# Patient Record
Sex: Female | Born: 1950 | Race: White | Hispanic: No | Smoking: Never smoker
Health system: Southern US, Community
[De-identification: ages and names within clinical notes are randomized; demographics above are authoritative.]

## PROBLEM LIST (undated history)

## (undated) DIAGNOSIS — E079 Disorder of thyroid, unspecified: Secondary | ICD-10-CM

## (undated) DIAGNOSIS — G4700 Insomnia, unspecified: Secondary | ICD-10-CM

## (undated) DIAGNOSIS — E785 Hyperlipidemia, unspecified: Secondary | ICD-10-CM

## (undated) DIAGNOSIS — L4050 Arthropathic psoriasis, unspecified: Secondary | ICD-10-CM

## (undated) DIAGNOSIS — Z227 Latent tuberculosis: Secondary | ICD-10-CM

## (undated) DIAGNOSIS — R002 Palpitations: Secondary | ICD-10-CM

## (undated) DIAGNOSIS — Z820 Family history of epilepsy and other diseases of the nervous system: Secondary | ICD-10-CM

## (undated) DIAGNOSIS — N920 Excessive and frequent menstruation with regular cycle: Secondary | ICD-10-CM

## (undated) DIAGNOSIS — L409 Psoriasis, unspecified: Secondary | ICD-10-CM

## (undated) HISTORY — DX: Latent tuberculosis: Z22.7

## (undated) HISTORY — DX: Hyperlipidemia, unspecified: E78.5

## (undated) HISTORY — DX: Arthropathic psoriasis, unspecified: L40.50

## (undated) HISTORY — DX: Palpitations: R00.2

## (undated) HISTORY — DX: Excessive and frequent menstruation with regular cycle: N92.0

## (undated) HISTORY — DX: Disorder of thyroid, unspecified: E07.9

## (undated) HISTORY — DX: Family history of epilepsy and other diseases of the nervous system: Z82.0

## (undated) HISTORY — PX: TONSILLECTOMY: SHX5217

## (undated) HISTORY — DX: Psoriasis, unspecified: L40.9

## (undated) HISTORY — DX: Insomnia, unspecified: G47.00

## (undated) HISTORY — PX: WISDOM TOOTH EXTRACTION: SHX21

---

## 1998-05-25 ENCOUNTER — Other Ambulatory Visit: Admission: RE | Admit: 1998-05-25 | Discharge: 1998-05-25 | Payer: Self-pay | Admitting: Obstetrics & Gynecology

## 2005-08-25 ENCOUNTER — Other Ambulatory Visit: Admission: RE | Admit: 2005-08-25 | Discharge: 2005-08-25 | Payer: Self-pay | Admitting: Obstetrics & Gynecology

## 2005-09-14 ENCOUNTER — Encounter: Admission: RE | Admit: 2005-09-14 | Discharge: 2005-09-14 | Payer: Self-pay | Admitting: Obstetrics & Gynecology

## 2005-10-04 ENCOUNTER — Encounter: Admission: RE | Admit: 2005-10-04 | Discharge: 2005-10-04 | Payer: Self-pay | Admitting: Obstetrics & Gynecology

## 2005-10-10 LAB — HM COLONOSCOPY

## 2007-10-16 ENCOUNTER — Other Ambulatory Visit: Admission: RE | Admit: 2007-10-16 | Discharge: 2007-10-16 | Payer: Self-pay | Admitting: Obstetrics & Gynecology

## 2007-11-13 LAB — HM DEXA SCAN

## 2007-11-26 ENCOUNTER — Encounter: Admission: RE | Admit: 2007-11-26 | Discharge: 2007-11-26 | Payer: Self-pay | Admitting: Obstetrics & Gynecology

## 2008-10-21 ENCOUNTER — Other Ambulatory Visit: Admission: RE | Admit: 2008-10-21 | Discharge: 2008-10-21 | Payer: Self-pay | Admitting: Obstetrics & Gynecology

## 2010-03-06 IMAGING — MG MM SCREEN MAMMOGRAM BILATERAL
4 series · 4 of 4 positions shown · non-contrast
Comparison: none

DG SCREEN MAMMOGRAM BILATERAL
Bilateral CC and MLO view(s) were taken.

DIGITAL SCREENING MAMMOGRAM WITH CAD:
The breast tissue is heterogeneously dense.  No masses or malignant type calcifications are 
identified.  Compared with prior studies.

[R CC]
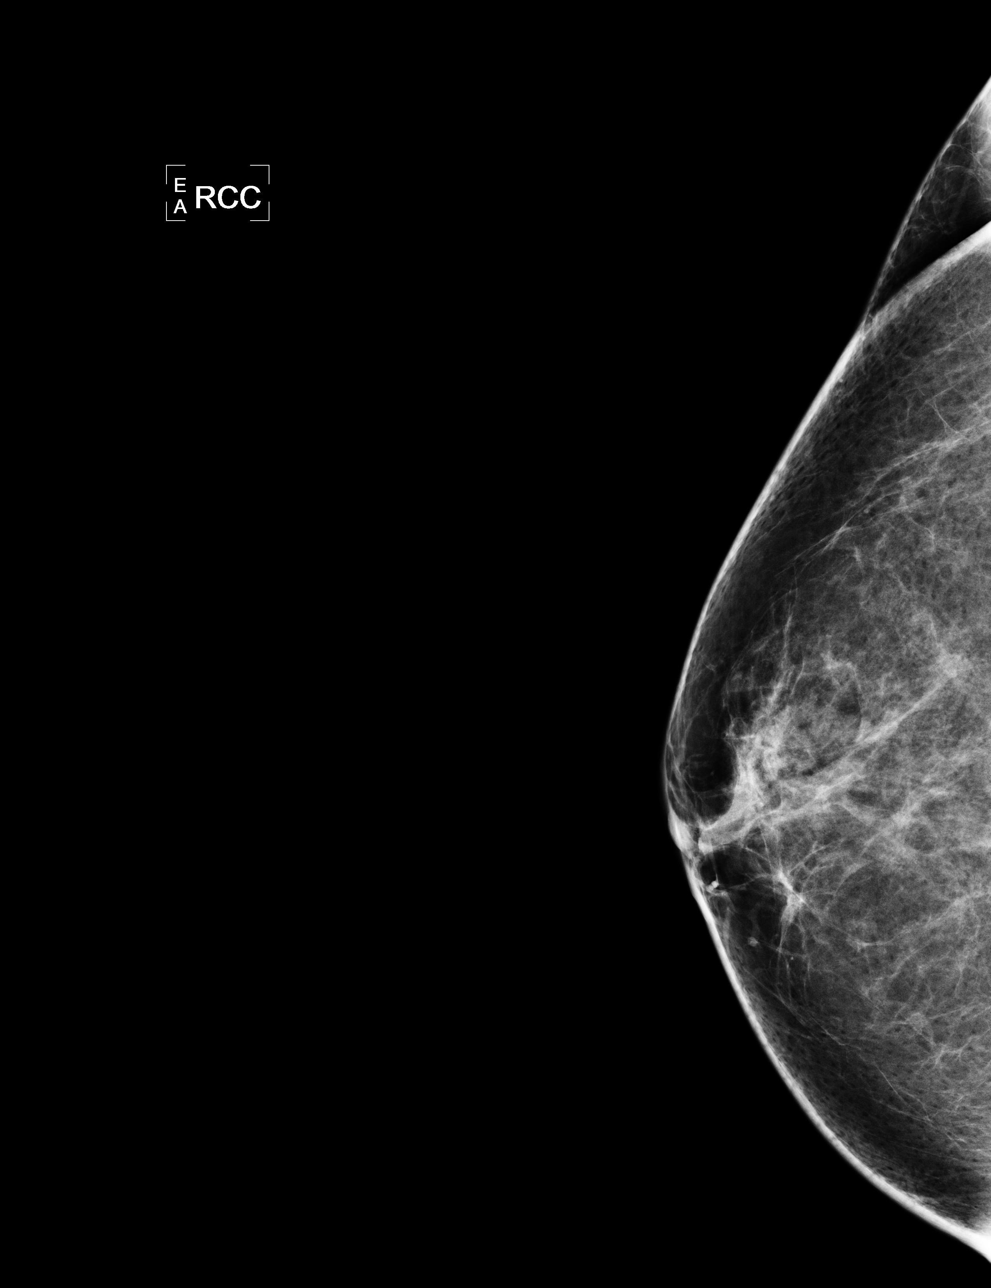

[L CC]
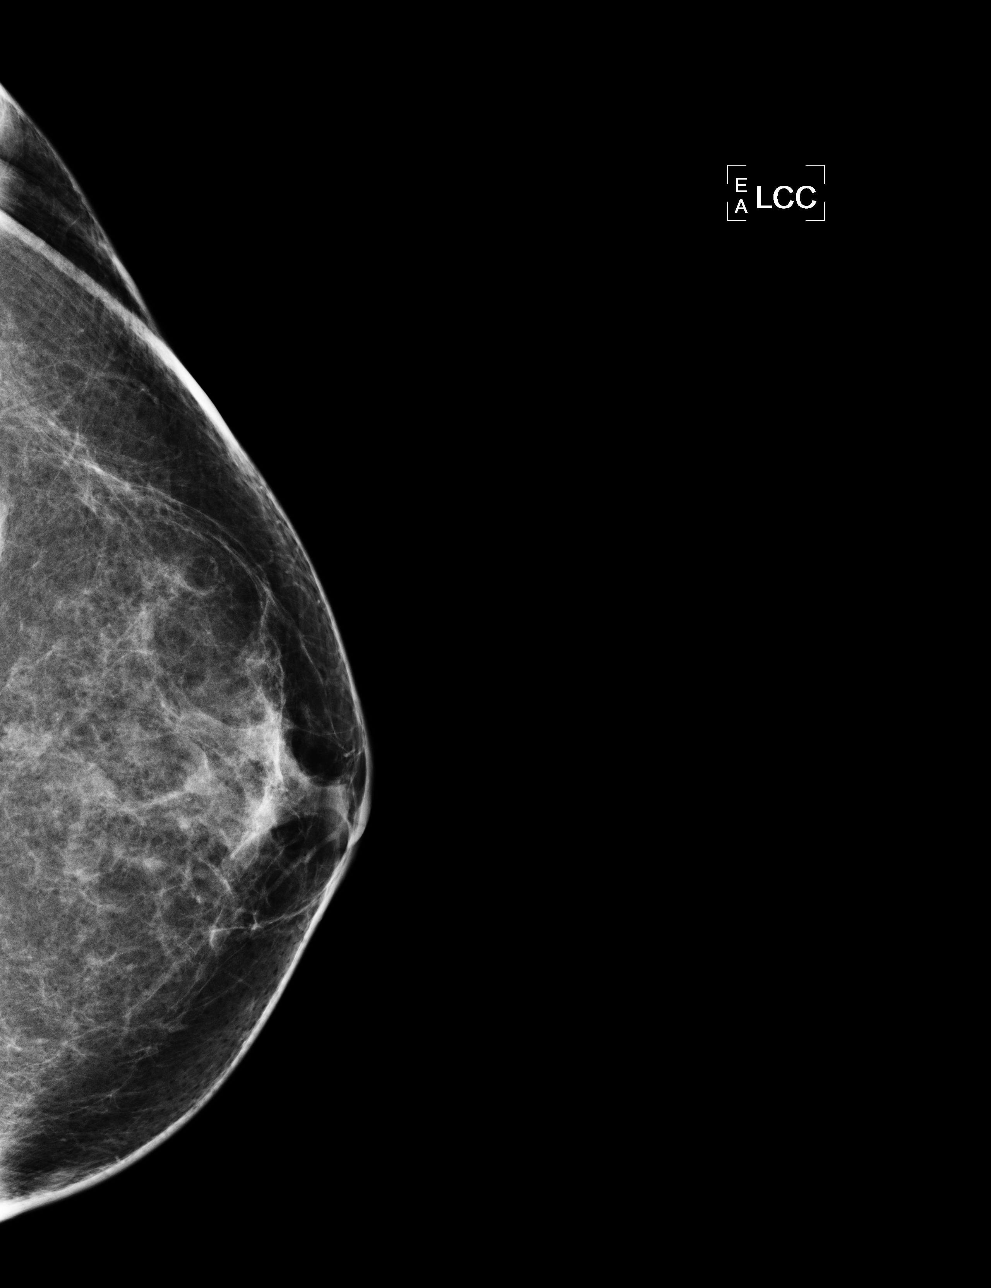

[L MLO]
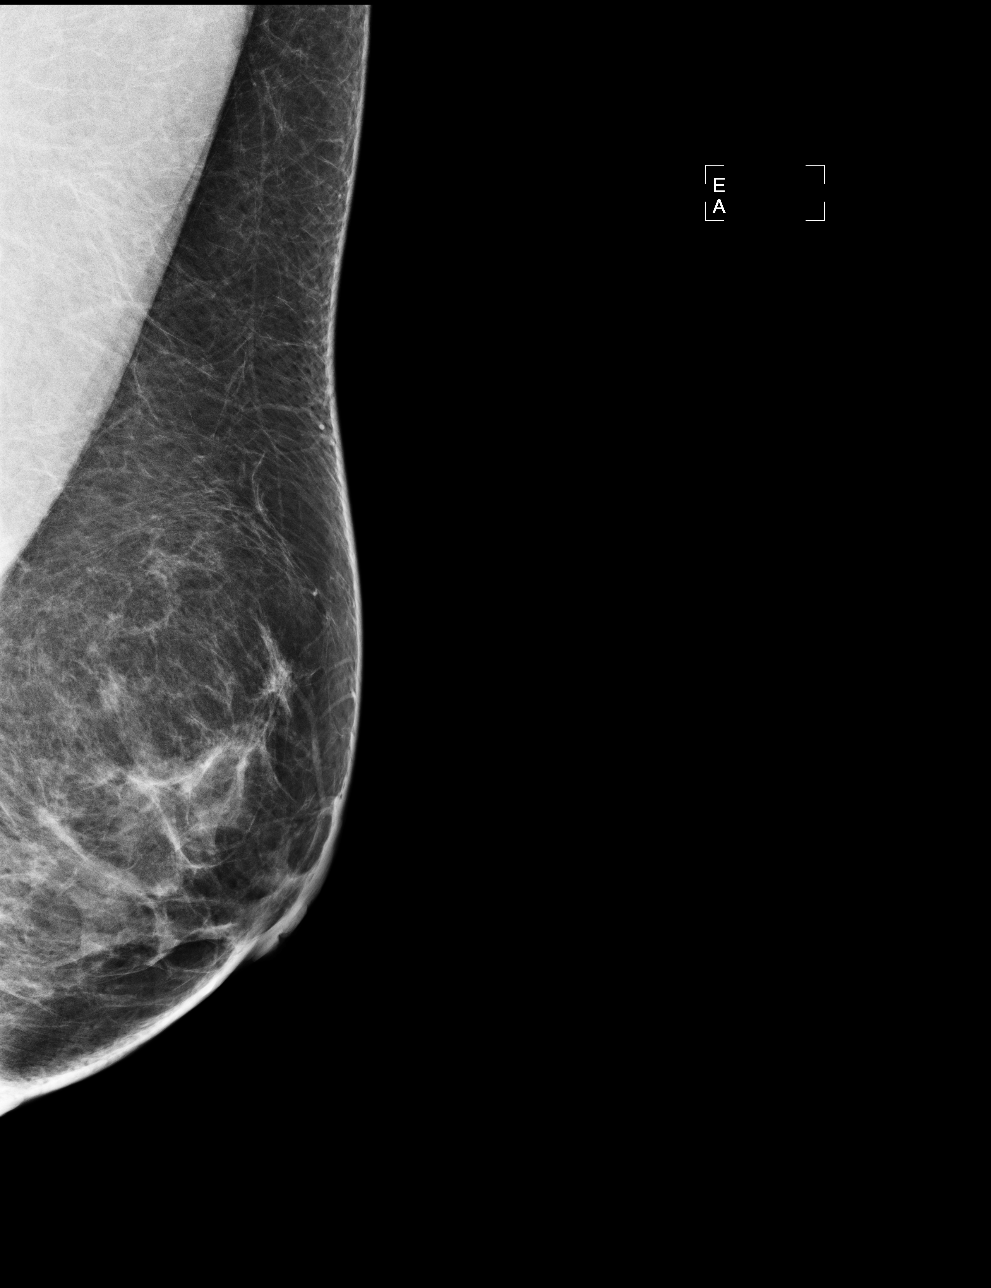

[R MLO]
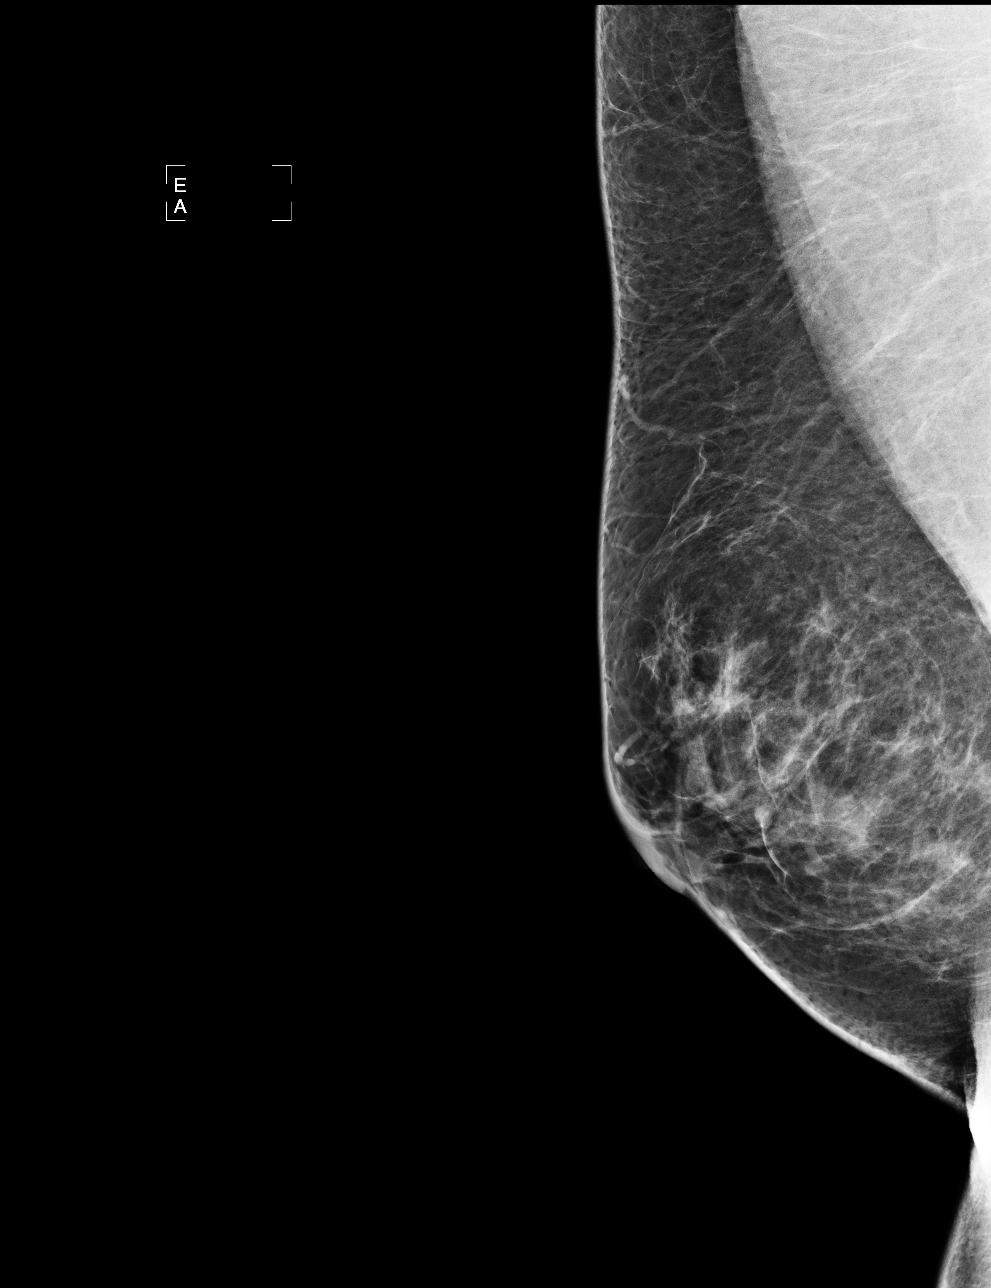

[4 of 4 positions shown; findings below may reference images not displayed]

IMPRESSION: No specific mammographic evidence of malignancy.  Next screening mammogram is recommended in one 
year.

ASSESSMENT: Negative - BI-RADS 1

Screening mammogram in 1 year.
ANALYZED BY COMPUTER AIDED DETECTION. , THIS PROCEDURE WAS A DIGITAL MAMMOGRAM.

## 2010-03-17 ENCOUNTER — Encounter: Admission: RE | Admit: 2010-03-17 | Discharge: 2010-03-17 | Payer: Self-pay | Admitting: Obstetrics & Gynecology

## 2011-02-13 ENCOUNTER — Other Ambulatory Visit: Payer: Self-pay | Admitting: Obstetrics & Gynecology

## 2011-02-13 DIAGNOSIS — Z1231 Encounter for screening mammogram for malignant neoplasm of breast: Secondary | ICD-10-CM

## 2011-03-23 ENCOUNTER — Ambulatory Visit
Admission: RE | Admit: 2011-03-23 | Discharge: 2011-03-23 | Disposition: A | Discharge status: Home / Self Care | Payer: BC Managed Care – PPO | Source: Ambulatory Visit | Attending: Obstetrics & Gynecology | Admitting: Obstetrics & Gynecology

## 2011-03-23 DIAGNOSIS — Z1231 Encounter for screening mammogram for malignant neoplasm of breast: Secondary | ICD-10-CM

## 2011-03-28 ENCOUNTER — Other Ambulatory Visit: Payer: Self-pay | Admitting: Obstetrics & Gynecology

## 2011-03-28 DIAGNOSIS — R928 Other abnormal and inconclusive findings on diagnostic imaging of breast: Secondary | ICD-10-CM

## 2011-03-30 ENCOUNTER — Ambulatory Visit
Admission: RE | Admit: 2011-03-30 | Discharge: 2011-03-30 | Disposition: A | Discharge status: Home / Self Care | Payer: BC Managed Care – PPO | Source: Ambulatory Visit | Attending: Obstetrics & Gynecology | Admitting: Obstetrics & Gynecology

## 2011-03-30 DIAGNOSIS — R928 Other abnormal and inconclusive findings on diagnostic imaging of breast: Secondary | ICD-10-CM

## 2011-10-23 ENCOUNTER — Encounter: Payer: Self-pay | Admitting: *Deleted

## 2012-02-14 ENCOUNTER — Other Ambulatory Visit: Payer: Self-pay | Admitting: Obstetrics & Gynecology

## 2012-02-14 DIAGNOSIS — Z1231 Encounter for screening mammogram for malignant neoplasm of breast: Secondary | ICD-10-CM

## 2012-03-14 DIAGNOSIS — Z227 Latent tuberculosis: Secondary | ICD-10-CM

## 2012-03-14 HISTORY — DX: Latent tuberculosis: Z22.7

## 2012-03-15 ENCOUNTER — Ambulatory Visit: Payer: BC Managed Care – PPO

## 2012-03-15 ENCOUNTER — Ambulatory Visit (INDEPENDENT_AMBULATORY_CARE_PROVIDER_SITE_OTHER): Payer: BC Managed Care – PPO | Admitting: Family Medicine

## 2012-03-15 ENCOUNTER — Telehealth: Payer: Self-pay | Admitting: Family Medicine

## 2012-03-15 VITALS — BP 126/70 | HR 72 | Temp 97.7°F | Resp 18 | Ht 64.5 in | Wt 157.2 lb

## 2012-03-15 DIAGNOSIS — R7611 Nonspecific reaction to tuberculin skin test without active tuberculosis: Secondary | ICD-10-CM

## 2012-03-15 NOTE — Telephone Encounter (Signed)
Message copied by Max Fickle on Fri Mar 15, 2012 12:21 PM ------      Message from: Abbe Amsterdam C      Created: Fri Mar 15, 2012 12:11 PM       Please call and let her know that her CXR was read as negative

## 2012-03-15 NOTE — Telephone Encounter (Signed)
Pt notified. Agrees to go to appointment at [facility]. Monday for positive TB test. JF

## 2012-03-15 NOTE — Progress Notes (Signed)
[facility] [address] [place] [address] [phone]  Date:  03/15/2012   Name:  Wendy Fletcher   DOB:  09/01/1950   MRN:  [id number]  PCP:  No primary provider on file.    Chief Complaint: PPD Reading   History of Present Illness:  Wendy Fletcher is a 61 y.o. very pleasant female patient who presents with the following:  She had a positive quantiferon gold- she is on Enbrel for psoriasis.  She has an ID appt coming up next week and would like to have her CXR done.   She is not coughing up blood, no unexpected weight loss. She had a PPD last year- she had a little induration but it was not a positive test.  No known exposures to TB, she has not traveled out of the country, and has never been in prison. 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    There is no problem list on file for this patient.   Past Medical History  Diagnosis Date  . Dyslipidemia   . Psoriasis   . Insomnia     long Hx of who presents for evaluation of palpitations and chest discomfort  . Palpitations   . FHx: Alzheimer's disease     Positive    Past Surgical History  Procedure Date  . Tonsillectomy     History  Substance Use Topics  . Smoking status: Never Smoker   . Smokeless tobacco: Not on file  . Alcohol Use: Yes     Wine    No family history on file.  No Known Allergies  Medication list has been reviewed and updated.  Current Outpatient Prescriptions on File Prior to Visit  Medication Sig Dispense Refill  . atorvastatin (LIPITOR) 10 MG tablet Take 10 mg by mouth daily.      Marland Kitchen levothyroxine (SYNTHROID, LEVOTHROID) 50 MCG tablet Take 50 mcg by mouth daily.      Marland Kitchen zolpidem (AMBIEN) 5 MG tablet Take 10 mg by mouth at bedtime as needed. Takes 1/2 tablet qhs      . Bromelains (BROMELAIN PO) Take 300 mg by mouth 2 (two) times daily.      Marland Kitchen etanercept (ENBREL) 50 MG/ML injection Inject 50 mg into the skin once a week. Twice      . NON FORMULARY Tumeric 600mg  Twice a Day        . sertraline (ZOLOFT) 50 MG tablet Take 50 mg by mouth daily.        Review of Systems:  As per HPI- otherwise negative.   Physical Examination: Filed Vitals:   03/15/12 1032  BP: 126/70  Pulse: 72  Temp: 97.7 F (36.5 C)  Resp: 18   Filed Vitals:   03/15/12 1032  Height: 5' 4.5" (1.638 m)  Weight: 157 lb 3.2 oz (71.305 kg)   Body mass index is 26.57 kg/(m^2). Ideal Body Weight: Weight in (lb) to have BMI = 25: 147.6   GEN: WDWN, NAD, Non-toxic, A & O x 3 HEENT: Atraumatic, Normocephalic. Neck supple. No masses, No LAD. Ears and Nose: No external deformity. CV: RRR, No M/G/R. No JVD. No thrill. No extra heart sounds. PULM: CTA B, no wheezes, crackles, rhonchi. No retractions. No resp. distress. No accessory muscle use. ABD: S, NT, ND, +BS. No rebound. No HSM. EXTR: No c/c/e NEURO Normal gait.  PSYCH: Normally interactive. Conversant. Not depressed or anxious appearing.  Calm demeanor.  [facility] reading (PRIMARY) by  Dr.  Lurene Robley.  Negative chest, no sign of acute pulmonary TB  CHEST - 2 VIEW  Comparison: 08/11/2010  Findings: Heart and mediastinal contours are within normal limits. No focal opacities or effusions. No acute bony abnormality.  IMPRESSION: No active cardiopulmonary disease.   Derm is Campbell Stall, ID is at [facility] ID, Dr. Drue Second  Assessment and Plan: 1. Positive TB test  DG Chest 2 View   Await radiology over- read report.  Will fax result to her doctors as above and let her know as well.   Jonee Lamore, MD  Called pt and let her know CXR read as negative, will fax to her doctors.

## 2012-03-18 ENCOUNTER — Ambulatory Visit (INDEPENDENT_AMBULATORY_CARE_PROVIDER_SITE_OTHER): Payer: BC Managed Care – PPO | Admitting: Internal Medicine

## 2012-03-18 ENCOUNTER — Encounter: Payer: Self-pay | Admitting: Internal Medicine

## 2012-03-18 VITALS — BP 131/81 | HR 73 | Temp 97.5°F | Wt 159.0 lb

## 2012-03-18 DIAGNOSIS — L409 Psoriasis, unspecified: Secondary | ICD-10-CM

## 2012-03-18 DIAGNOSIS — Z227 Latent tuberculosis: Secondary | ICD-10-CM

## 2012-03-18 DIAGNOSIS — L408 Other psoriasis: Secondary | ICD-10-CM

## 2012-03-18 DIAGNOSIS — A150 Tuberculosis of lung: Secondary | ICD-10-CM

## 2012-03-18 LAB — COMPREHENSIVE METABOLIC PANEL
ALT: 18 U/L (ref 0–35)
AST: 21 U/L (ref 0–37)
Albumin: 4.6 g/dL (ref 3.5–5.2)
Alkaline Phosphatase: 46 U/L (ref 39–117)
BUN: 17 mg/dL (ref 6–23)
CO2: 27 mEq/L (ref 19–32)
Calcium: 9.3 mg/dL (ref 8.4–10.5)
Chloride: 103 mEq/L (ref 96–112)
Creat: 0.85 mg/dL (ref 0.50–1.10)
Glucose, Bld: 85 mg/dL (ref 70–99)
Potassium: 4.2 mEq/L (ref 3.5–5.3)
Sodium: 140 mEq/L (ref 135–145)
Total Bilirubin: 0.4 mg/dL (ref 0.3–1.2)
Total Protein: 6.8 g/dL (ref 6.0–8.3)

## 2012-03-18 MED ORDER — ISONIAZID 300 MG PO TABS: 900.0000 mg | ORAL_TABLET | Freq: Every day | ORAL | Status: AC

## 2012-03-18 MED ORDER — RIFAPENTINE 150 MG PO TABS: 900.0000 mg | ORAL_TABLET | ORAL | Status: DC

## 2012-03-18 NOTE — Progress Notes (Signed)
INFECTIOUS DISEASES CLINIC  RFV: newly positive quantiferon in patient on enbrel Subjective:    Patient ID: Wendy Fletcher, female    DOB: November 01, 1950, 61 y.o.   MRN: [id number]  HPI Wendy Fletcher is 61yo F with hypothyroidism, HLD, and psoriasis who has been on etanercept for the past 3 years. She was tested this year with IGRA which was positive, where as last year her skin test had no induration but slow to resolve erythema/- IGRA. She was referred to our clinic to seek evaluation and treatment for latent TB. She has had a baseline CXR which was unremarkable. Wendy Fletcher has denied having any new symptoms of productive cough, weight loss or persistent nightsweats. She does feel that she has an occ cough that she associates with seasonal allergies and has nightsweats intermittently due to menopause.   No recent international travel, she did travel to [place] and [place] for church mission trips roughly 2 years ago. She states that her trip in [place] was mostly demolition/not much interaction with [place]  She works full time doing Airline pilot in [employer], [employer]. No smoking. Drinks wine.  She describes her psoriasis involving extensor surfaces of arms/elbows and involves torso and legs, thought to look like a "bad rash".Since having + quantiferon, she has been taking off of enbrel for the last 10 days.  Current Outpatient Prescriptions on File Prior to Visit  Medication Sig Dispense Refill  . atorvastatin (LIPITOR) 10 MG tablet Take 10 mg by mouth daily.      . Bromelains (BROMELAIN PO) Take 300 mg by mouth 2 (two) times daily.      Marland Kitchen etanercept (ENBREL) 50 MG/ML injection Inject 50 mg into the skin once a week. Twice      . levothyroxine (SYNTHROID, LEVOTHROID) 50 MCG tablet Take 50 mcg by mouth daily.      . NON FORMULARY Tumeric 600mg  Twice a Day      . Ospemifene (OSPHENA) 60 MG TABS Take 60 mg by mouth daily.      . sertraline (ZOLOFT) 100 MG tablet Take 100 mg by mouth. Takes 200 mg daily      .  sertraline (ZOLOFT) 50 MG tablet Take 50 mg by mouth daily.      Marland Kitchen zolpidem (AMBIEN) 5 MG tablet Take 10 mg by mouth at bedtime as needed. Takes 1/2 tablet qhs       Active Ambulatory Problems    Diagnosis Date Noted  . No Active Ambulatory Problems   Resolved Ambulatory Problems    Diagnosis Date Noted  . No Resolved Ambulatory Problems   Past Medical History  Diagnosis Date  . Dyslipidemia   . Psoriasis   . Insomnia   . Palpitations   . FHx: Alzheimer's disease    History  Substance Use Topics  . Smoking status: Never Smoker   . Smokeless tobacco: Not on file  . Alcohol Use: Yes     Wine   Family hx = alzheimer's disease = father/ skin cancer = mother  Review of Systems   Constitutional: Negative for fever, chills, diaphoresis, activity change, appetite change, fatigue and unexpected weight change.  HENT: Negative for congestion, sore throat, rhinorrhea, sneezing, trouble swallowing and sinus pressure.  Eyes: Negative for photophobia and visual disturbance.  Respiratory: Negative for cough, chest tightness, shortness of breath, wheezing and stridor.  Cardiovascular: Negative for chest pain, palpitations and leg swelling.  Gastrointestinal: Negative for nausea, vomiting, abdominal pain, diarrhea, constipation, blood in stool, abdominal distention and  anal bleeding.  Genitourinary: Negative for dysuria, hematuria, flank pain and difficulty urinating.  Musculoskeletal: Negative for myalgias, back pain, joint swelling, arthralgias and gait problem.  Skin: per hpi Neurological: Negative for dizziness, tremors, weakness and light-headedness.  Hematological: Negative for adenopathy. Does not bruise/bleed easily.  Psychiatric/Behavioral: Negative for behavioral problems, confusion, sleep disturbance, dysphoric mood, decreased concentration and agitation.        Objective:   Physical Exam  BP 131/81  Pulse 73  Temp 97.5 F (36.4 C) (Oral)  Wt 159 lb (72.122  kg)   General Appearance:    Alert, cooperative, no distress, appears stated age  Head:    Normocephalic, without obvious abnormality, atraumatic  Eyes:    PERRL, conjunctiva/corneas clear, EOM's intact,  Ears:    Normal TM's and external ear canals, both ears  Nose:   Nares normal, septum midline, mucosa normal, no drainage    or sinus tenderness  Throat:   Lips, mucosa, and tongue normal; teeth and gums normal  Neck:   Supple, symmetrical, trachea midline, no adenopathy;         Lungs:     Clear to auscultation bilaterally, respirations unlabored      Heart:    Regular rate and rhythm, S1 and S2 normal, no murmur, rub   or gallop              Extremities:   Extremities normal, atraumatic, no cyanosis or edema  Pulses:   2+ and symmetric all extremities  Skin:   Dry erythamatous areas noted on elbows bilaterally, scattered < cm erythamatous, scaly lesions to lower extremities  Lymph nodes:   Cervical, supraclavicular, and axillary nodes normal     Historic images: cxr negative for signs for TB     Assessment & Plan:  Latent TB = It appears that the patient has recently been exposed to TB within the last year since her IGRA negative in 2012, and now positive. Unclear where she could have had TB exposure since no recent international travel. We reviewed that she has 10% lifetime risk of getting TB, with 5% in the first year. Drugs such as ertanecept places one at risk for active TB thus highly recommended to get treatment.  We have discussed the various options for latent TB ( 9 mo INH, 4 mo RIF, or 12-weekly doses of INH plus rifapentine- under modified DOT). The patient would like to try the quickest option so that she can resume back on enbrel for her psoriasis.  She is willing to try 12- week modified DOT regimen of INH plus rifapentine. She will dutifully call on a weekly basis after taking her medication. She understands that she will need to either call or come to clinic to be  observed taking the medication.  - INH 900mg  weekly ( x 12 wks) - rifapentine 900mg  weekly ( x 12 wks) - vitamin B 6, 50mg , take 1 tablet daily  - asked to abstain from alcohol the day of the week she chooses to take medications - reviewed SE of medications - provided CDC handout on th e 12-dose regimen for LTBI   we will get baseline CMP and recheck in 4 wks.  RTC in 4 wks.

## 2012-03-19 ENCOUNTER — Telehealth: Payer: Self-pay | Admitting: Licensed Clinical Social Worker

## 2012-03-19 NOTE — Telephone Encounter (Signed)
Pharmacist called to verify quantity of Rifapentine which was 6 tablets weekly to equal 900 mg. Pharmacist states usual dosage is 8 tablets weekly and it is pre packaged that way the patient will get 96 instead of 72 but she will instruct the patient only to take 6 weekly instead of 8.

## 2012-03-25 ENCOUNTER — Ambulatory Visit
Admission: RE | Admit: 2012-03-25 | Discharge: 2012-03-25 | Disposition: A | Discharge status: Home / Self Care | Payer: BC Managed Care – PPO | Source: Ambulatory Visit | Attending: Obstetrics & Gynecology | Admitting: Obstetrics & Gynecology

## 2012-03-25 DIAGNOSIS — Z1231 Encounter for screening mammogram for malignant neoplasm of breast: Secondary | ICD-10-CM

## 2012-03-26 ENCOUNTER — Ambulatory Visit: Payer: BC Managed Care – PPO | Admitting: Internal Medicine

## 2012-04-03 ENCOUNTER — Ambulatory Visit (INDEPENDENT_AMBULATORY_CARE_PROVIDER_SITE_OTHER): Payer: BC Managed Care – PPO | Admitting: Internal Medicine

## 2012-04-03 ENCOUNTER — Encounter: Payer: Self-pay | Admitting: Internal Medicine

## 2012-04-03 VITALS — BP 110/70 | HR 118 | Temp 99.9°F | Wt 160.0 lb

## 2012-04-03 DIAGNOSIS — R110 Nausea: Secondary | ICD-10-CM

## 2012-04-03 LAB — CBC WITH DIFFERENTIAL/PLATELET
Basophils Absolute: 0 10*3/uL (ref 0.0–0.1)
Basophils Relative: 0 % (ref 0–1)
Eosinophils Absolute: 0 10*3/uL (ref 0.0–0.7)
Eosinophils Relative: 0 % (ref 0–5)
HCT: 36 % (ref 36.0–46.0)
Hemoglobin: 12.4 g/dL (ref 12.0–15.0)
Lymphocytes Relative: 2 % — ABNORMAL LOW (ref 12–46)
Lymphs Abs: 0.1 10*3/uL — ABNORMAL LOW (ref 0.7–4.0)
MCH: 30.2 pg (ref 26.0–34.0)
MCHC: 34.4 g/dL (ref 30.0–36.0)
MCV: 87.8 fL (ref 78.0–100.0)
Monocytes Absolute: 0.1 10*3/uL (ref 0.1–1.0)
Monocytes Relative: 2 % — ABNORMAL LOW (ref 3–12)
Neutro Abs: 6.6 10*3/uL (ref 1.7–7.7)
Neutrophils Relative %: 96 % — ABNORMAL HIGH (ref 43–77)
Platelets: 155 10*3/uL (ref 150–400)
RBC: 4.1 MIL/uL (ref 3.87–5.11)
RDW: 13.1 % (ref 11.5–15.5)
WBC: 6.9 10*3/uL (ref 4.0–10.5)

## 2012-04-03 MED ORDER — PROMETHAZINE HCL 12.5 MG PO TABS: 12.5000 mg | ORAL_TABLET | Freq: Four times a day (QID) | ORAL | Status: DC | PRN

## 2012-04-03 NOTE — Progress Notes (Signed)
INFECTIOUS DISEASES CLINIC   RFV: sudden onset chills, myalgias fever while on latent TB treatment Subjective:    Patient ID: Wendy Fletcher, female    DOB: 06-Apr-1951, 61 y.o.   MRN: [id number]  HPI Wendy Fletcher is 61yo F with hypothyroidism, HLD, and psoriasis who has been on etanercept for the past 3 years. She was tested this year with IGRA which was positive, where as last year her skin test had no induration but slow to resolve erythema/- IGRA. She was referred to our clinic to seek evaluation and treatment for latent TB. She has had a baseline CXR which was unremarkable. She has been off of enbrel for the last 4 wks, and started to take weekly doses of rifapentine and INH in addition to vitamin B6. She states that she notices having a day's worth of nausea after taking her weekly dose. Last night she took her scheduled dose at 6:30pm on Tuesday night. This is her 3rd week of treatment. She woke up at 4am with shaking chills, aching and headache. Eased off 5 hrs later, then temp of 101. With headache behind eyes. No vomiting but nausea. No neck pain. Just muscle aches from rigors. Feels fatigued this morning due to poor sleep from this event. She states that she also has mild abdominal discomfort, mostly on LUQ/LLQ. She took a dose of tylenol this morning as instructed over the phone which has helped her fever. No sick contacts. One friend with a cold.  Current Outpatient Prescriptions on File Prior to Visit  Medication Sig Dispense Refill  . atorvastatin (LIPITOR) 10 MG tablet Take 10 mg by mouth daily.      . Bromelains (BROMELAIN PO) Take 300 mg by mouth 2 (two) times daily.      Marland Kitchen etanercept (ENBREL) 50 MG/ML injection Inject 50 mg into the skin once a week. Twice      . levothyroxine (SYNTHROID, LEVOTHROID) 50 MCG tablet Take 50 mcg by mouth daily.      . NON FORMULARY Tumeric 600mg  Twice a Day      . Ospemifene (OSPHENA) 60 MG TABS Take 60 mg by mouth daily.      . Rifapentine 150 MG TABS  Take 6 tablets (900 mg total) by mouth once a week.  72 tablet  0  . sertraline (ZOLOFT) 100 MG tablet Take 100 mg by mouth. Takes 200 mg daily      . sertraline (ZOLOFT) 50 MG tablet Take 50 mg by mouth daily.      Marland Kitchen zolpidem (AMBIEN) 5 MG tablet Take 10 mg by mouth at bedtime as needed. Takes 1/2 tablet qhs      . promethazine (PHENERGAN) 12.5 MG tablet Take 1 tablet (12.5 mg total) by mouth every 6 (six) hours as needed for nausea.  30 tablet  0   Active Ambulatory Problems    Diagnosis Date Noted  . 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 Psoriasis 03/18/2012   Resolved Ambulatory Problems    Diagnosis Date Noted  . No Resolved Ambulatory Problems   Past Medical History  Diagnosis Date  . Dyslipidemia   . Insomnia   . Palpitations   . FHx: Alzheimer's disease     Review of Systems Constitutional: Negative for fever, chills, diaphoresis, activity change, appetite change, fatigue and unexpected weight change.  HENT: Negative for congestion, sore throat, rhinorrhea, sneezing, trouble swallowing and sinus pressure.  Eyes: Negative for photophobia and visual disturbance.  Respiratory: Negative for cough, chest tightness, shortness of  breath, wheezing and stridor.  Cardiovascular: Negative for chest pain, palpitations and leg swelling.  Gastrointestinal: Negative for nausea, vomiting, abdominal pain, diarrhea, constipation, blood in stool, abdominal distention and anal bleeding.  Genitourinary: Negative for dysuria, hematuria, flank pain and difficulty urinating.  Musculoskeletal: Negative for myalgias, back pain, joint swelling, arthralgias and gait problem.  Skin: Negative for color change, pallor, rash and wound.  Neurological: Negative for dizziness, tremors, weakness and light-headedness.  Hematological: Negative for adenopathy. Does not bruise/bleed easily.  Psychiatric/Behavioral: Negative for behavioral problems, confusion, sleep disturbance, dysphoric mood, decreased concentration and  agitation.     Objective:   Physical Exam  BP 110/70  Pulse 118  Temp 99.9 F (37.7 C) (Oral)  Wt 160 lb (72.576 kg)   General Appearance:    Alert, cooperative, no distress, appears stated age  Head:    Normocephalic, without obvious abnormality, atraumatic  Eyes:    PERRL, conjunctiva/corneas clear, EOM's intact,  Ears:    Normal TM's and external ear canals, both ears  Nose:   Nares normal, septum midline, mucosa normal, no drainage    or sinus tenderness  Throat:   Lips, mucosa, and tongue normal; teeth and gums normal  Neck:   Supple, symmetrical, trachea midline, no adenopathy;      Back:     Symmetric, no curvature, ROM normal, no CVA tenderness  Lungs:     Clear to auscultation bilaterally, respirations unlabored  Chest Wall:    No tenderness or deformity   Heart:    Regular rate and rhythm, S1 and S2 normal, no murmur, rub   or gallop     Abdomen:     Soft, non-tender, bowel sounds active all four quadrants,    no masses, no organomegaly        Extremities:   Extremities normal, atraumatic, no cyanosis or edema  Pulses:   2+ and symmetric all extremities  Skin:   Skin color, +rash consistent with psoriasis  Lymph nodes:   Cervical, supraclavicular, and axillary nodes normal           Assessment & Plan:   61 yo F in previous good health presents with acute onset of ILI, fever, rigors, myalgia, mild headache. Unclear etiology.concern for Drug SE due to latent TB regimen vs. Viral illness.  This could possibly be drug SE, but other ddx of viral illness would also be part of differential  - will ask her to continue with supportive care. Tylenol for fevers, drink fluids to keep hydrated. Anti emetic prn  Will check cbc with diff, and cmp.  Will call pt tomorrow with results and check in on Friday to see if need to come back to clinic  Nausea = give rx of phenergan 12.5mg  Q6hr prn  Latent TB = will decide in the next few days if we need to change to a different  regimen, such as rifampin 300mg  BID vs. Staying on the present regimen of 12 weekly doses of rifapentin, and INH.  25 minutes spent with patient to assess current illness, counseling and coordinating testing.

## 2012-04-04 LAB — COMPREHENSIVE METABOLIC PANEL
ALT: 23 U/L (ref 0–35)
AST: 23 U/L (ref 0–37)
Albumin: 4.7 g/dL (ref 3.5–5.2)
Alkaline Phosphatase: 47 U/L (ref 39–117)
BUN: 21 mg/dL (ref 6–23)
CO2: 23 mEq/L (ref 19–32)
Calcium: 9.4 mg/dL (ref 8.4–10.5)
Chloride: 102 mEq/L (ref 96–112)
Creat: 0.97 mg/dL (ref 0.50–1.10)
Glucose, Bld: 109 mg/dL — ABNORMAL HIGH (ref 70–99)
Potassium: 4.2 mEq/L (ref 3.5–5.3)
Sodium: 137 mEq/L (ref 135–145)
Total Bilirubin: 1 mg/dL (ref 0.3–1.2)
Total Protein: 6.8 g/dL (ref 6.0–8.3)

## 2012-04-16 ENCOUNTER — Other Ambulatory Visit: Payer: Self-pay | Admitting: Internal Medicine

## 2012-04-16 DIAGNOSIS — Z227 Latent tuberculosis: Secondary | ICD-10-CM

## 2012-04-16 MED ORDER — RIFAMPIN 300 MG PO CAPS: 300.0000 mg | ORAL_CAPSULE | Freq: Two times a day (BID) | ORAL | Status: AC

## 2012-04-18 ENCOUNTER — Encounter: Payer: Self-pay | Admitting: Internal Medicine

## 2012-04-18 ENCOUNTER — Ambulatory Visit (INDEPENDENT_AMBULATORY_CARE_PROVIDER_SITE_OTHER): Payer: BC Managed Care – PPO | Admitting: Internal Medicine

## 2012-04-18 VITALS — BP 118/78 | HR 72 | Temp 97.9°F | Wt 154.4 lb

## 2012-04-18 DIAGNOSIS — Z227 Latent tuberculosis: Secondary | ICD-10-CM

## 2012-04-18 DIAGNOSIS — A150 Tuberculosis of lung: Secondary | ICD-10-CM

## 2012-04-18 LAB — CBC WITH DIFFERENTIAL/PLATELET
Basophils Absolute: 0.2 10*3/uL — ABNORMAL HIGH (ref 0.0–0.1)
Basophils Relative: 2 % — ABNORMAL HIGH (ref 0–1)
Eosinophils Absolute: 0.1 10*3/uL (ref 0.0–0.7)
Eosinophils Relative: 2 % (ref 0–5)
HCT: 35.1 % — ABNORMAL LOW (ref 36.0–46.0)
Hemoglobin: 12.2 g/dL (ref 12.0–15.0)
Lymphocytes Relative: 31 % (ref 12–46)
Lymphs Abs: 2 10*3/uL (ref 0.7–4.0)
MCH: 30.7 pg (ref 26.0–34.0)
MCHC: 34.8 g/dL (ref 30.0–36.0)
MCV: 88.4 fL (ref 78.0–100.0)
Monocytes Absolute: 0.5 10*3/uL (ref 0.1–1.0)
Monocytes Relative: 8 % (ref 3–12)
Neutro Abs: 3.8 10*3/uL (ref 1.7–7.7)
Neutrophils Relative %: 57 % (ref 43–77)
Platelets: 342 10*3/uL (ref 150–400)
RBC: 3.97 MIL/uL (ref 3.87–5.11)
RDW: 13.4 % (ref 11.5–15.5)
WBC: 6.6 10*3/uL (ref 4.0–10.5)

## 2012-04-18 LAB — COMPLETE METABOLIC PANEL WITH GFR
ALT: 106 U/L — ABNORMAL HIGH (ref 0–35)
AST: 51 U/L — ABNORMAL HIGH (ref 0–37)
Albumin: 4.4 g/dL (ref 3.5–5.2)
Alkaline Phosphatase: 65 U/L (ref 39–117)
BUN: 17 mg/dL (ref 6–23)
CO2: 27 mEq/L (ref 19–32)
Calcium: 9.1 mg/dL (ref 8.4–10.5)
Chloride: 105 mEq/L (ref 96–112)
Creat: 0.85 mg/dL (ref 0.50–1.10)
GFR, Est African American: 87 mL/min
GFR, Est Non African American: 76 mL/min
Glucose, Bld: 87 mg/dL (ref 70–99)
Potassium: 4.6 mEq/L (ref 3.5–5.3)
Sodium: 139 mEq/L (ref 135–145)
Total Bilirubin: 0.9 mg/dL (ref 0.3–1.2)
Total Protein: 7.1 g/dL (ref 6.0–8.3)

## 2012-04-18 NOTE — Progress Notes (Signed)
INFECTIOUS DISEASE CLINIC  RFV: latent TB treatment Subjective:    Patient ID: Wendy Fletcher, female    DOB: 1950/11/08, 61 y.o.   MRN: [id number]  [facility] yo F with psoriasis previously on enbrel. Had recent + quantiferon and came to ID clinic roughly 4-5 wks ago to seek treatment for latent TB. She tried to do 12 week regimen but had significant SE on the 3rd and 4th week of dosing. ILI with fevers and malaise that would resolve after 5 days. Initially thought to be viral illness, but then symptoms occurred again after rechallenge this past week. Thus, i asked her to discontinue INH and rifapentin and switched to rifampin Tuesday night . Still fatigued from last bout of symptoms. Appetite decreased but increased thrust.   Dermatologist seen last week regarding her psoriasis. Getting worse on calves, elbows. At this stage it is feasibly worse than our last visit.  Review of Systems Review of Systems  Constitutional:+ fatigue. Negative for fever, chills, diaphoresis, activity change, appetite change, fatigue and unexpected weight change.  HENT: Negative for congestion, sore throat, rhinorrhea, sneezing, trouble swallowing and sinus pressure.  Eyes: Negative for photophobia and visual disturbance.  Respiratory: Negative for cough, chest tightness, shortness of breath, wheezing and stridor.  Cardiovascular: Negative for chest pain, palpitations and leg swelling.  Gastrointestinal: Negative for nausea, vomiting, abdominal pain, diarrhea, constipation, blood in stool, abdominal distention and anal bleeding.  Genitourinary: Negative for dysuria, hematuria, flank pain and difficulty urinating.  Musculoskeletal: Negative for myalgias, back pain, joint swelling, arthralgias and gait problem.  Skin: worsening psoriasis Neurological: Negative for dizziness, tremors, weakness and light-headedness.  Hematological: Negative for adenopathy. Does not bruise/bleed easily.  Psychiatric/Behavioral: Negative for  behavioral problems, confusion, sleep disturbance, dysphoric mood, decreased concentration and agitation.       Objective:   Physical Exam  BP 118/78  Pulse 72  Temp 97.9 F (36.6 C) (Oral)  Wt 154 lb 6 oz (70.024 kg) Physical Exam  Constitutional:  oriented to person, place, and time. appears well-developed and well-nourished. No distress.  HENT:  Mouth/Throat: Oropharynx is clear and moist. No oropharyngeal exudate.  Cardiovascular: Normal rate, regular rhythm and normal heart sounds. Exam reveals no gallop and no friction rub. No murmur heard.  Pulmonary/Chest: Effort normal and breath sounds normal. No respiratory distress.  no wheezes.  Abdominal: Soft. Bowel sounds are normal. He exhibits no distension. There is no tenderness.  Lymphadenopathy:  no cervical adenopathy.  Neurological: He is alert and oriented to person, place, and time.  Skin: worsening lesions to back of her calves bilaterally. Some plaques on elbows and scattered erythamatous lesions to arms.         Assessment & Plan:  Latent tb: will finish up her course with rifampin 300mg  BID to take for the next 3 months to finish 4 month course of therapy.  - labs: will check cbc, cmp, magnesium and phosphorus  Psoriasis = can resume therapy for psoriasis in 1 month if need be. Ideally would like her to finish her therapy for latent tb, but if her psoriasis worsens than she can start back on enbrel in 4-6 wks - dermatologist -> Wendy Fletcher.

## 2012-05-02 ENCOUNTER — Other Ambulatory Visit: Payer: Self-pay | Admitting: Internal Medicine

## 2012-05-02 DIAGNOSIS — Z227 Latent tuberculosis: Secondary | ICD-10-CM

## 2012-05-03 ENCOUNTER — Other Ambulatory Visit: Payer: BC Managed Care – PPO

## 2012-05-03 DIAGNOSIS — Z227 Latent tuberculosis: Secondary | ICD-10-CM

## 2012-05-03 LAB — CBC WITH DIFFERENTIAL/PLATELET
Basophils Absolute: 0.1 10*3/uL (ref 0.0–0.1)
Basophils Relative: 1 % (ref 0–1)
Eosinophils Absolute: 0.2 10*3/uL (ref 0.0–0.7)
Eosinophils Relative: 3 % (ref 0–5)
HCT: 36.9 % (ref 36.0–46.0)
Hemoglobin: 12.2 g/dL (ref 12.0–15.0)
Lymphocytes Relative: 31 % (ref 12–46)
Lymphs Abs: 1.5 10*3/uL (ref 0.7–4.0)
MCH: 30.3 pg (ref 26.0–34.0)
MCHC: 33.1 g/dL (ref 30.0–36.0)
MCV: 91.8 fL (ref 78.0–100.0)
Monocytes Absolute: 0.3 10*3/uL (ref 0.1–1.0)
Monocytes Relative: 5 % (ref 3–12)
Neutro Abs: 3 10*3/uL (ref 1.7–7.7)
Neutrophils Relative %: 60 % (ref 43–77)
Platelets: 188 10*3/uL (ref 150–400)
RBC: 4.02 MIL/uL (ref 3.87–5.11)
RDW: 13.5 % (ref 11.5–15.5)
WBC: 4.9 10*3/uL (ref 4.0–10.5)

## 2012-05-03 LAB — MAGNESIUM: Magnesium: 2 mg/dL (ref 1.5–2.5)

## 2012-05-03 LAB — PHOSPHORUS: Phosphorus: 4.3 mg/dL (ref 2.3–4.6)

## 2012-05-04 LAB — COMPLETE METABOLIC PANEL WITH GFR
ALT: 30 U/L (ref 0–35)
AST: 23 U/L (ref 0–37)
Albumin: 4.4 g/dL (ref 3.5–5.2)
Alkaline Phosphatase: 49 U/L (ref 39–117)
BUN: 16 mg/dL (ref 6–23)
CO2: 26 mEq/L (ref 19–32)
Calcium: 9.5 mg/dL (ref 8.4–10.5)
Chloride: 101 mEq/L (ref 96–112)
Creat: 0.88 mg/dL (ref 0.50–1.10)
GFR, Est African American: 84 mL/min
GFR, Est Non African American: 73 mL/min
Glucose, Bld: 125 mg/dL — ABNORMAL HIGH (ref 70–99)
Potassium: 4.4 mEq/L (ref 3.5–5.3)
Sodium: 137 mEq/L (ref 135–145)
Total Bilirubin: 0.5 mg/dL (ref 0.3–1.2)
Total Protein: 6.7 g/dL (ref 6.0–8.3)

## 2012-05-14 ENCOUNTER — Ambulatory Visit
Admission: RE | Admit: 2012-05-14 | Discharge: 2012-05-14 | Disposition: A | Discharge status: Home / Self Care | Payer: BC Managed Care – PPO | Source: Ambulatory Visit | Attending: Sports Medicine | Admitting: Sports Medicine

## 2012-05-14 ENCOUNTER — Other Ambulatory Visit: Payer: Self-pay | Admitting: Sports Medicine

## 2012-05-14 DIAGNOSIS — M25561 Pain in right knee: Secondary | ICD-10-CM

## 2012-05-21 ENCOUNTER — Encounter: Payer: Self-pay | Admitting: Internal Medicine

## 2012-05-21 ENCOUNTER — Ambulatory Visit (INDEPENDENT_AMBULATORY_CARE_PROVIDER_SITE_OTHER): Payer: BC Managed Care – PPO | Admitting: Internal Medicine

## 2012-05-21 VITALS — BP 135/73 | HR 89 | Temp 97.6°F | Wt 159.0 lb

## 2012-05-21 DIAGNOSIS — Z227 Latent tuberculosis: Secondary | ICD-10-CM

## 2012-05-21 DIAGNOSIS — A150 Tuberculosis of lung: Secondary | ICD-10-CM

## 2012-05-21 DIAGNOSIS — Z23 Encounter for immunization: Secondary | ICD-10-CM

## 2012-05-21 LAB — COMPREHENSIVE METABOLIC PANEL
ALT: 22 U/L (ref 0–35)
AST: 20 U/L (ref 0–37)
Albumin: 4.6 g/dL (ref 3.5–5.2)
Alkaline Phosphatase: 47 U/L (ref 39–117)
BUN: 20 mg/dL (ref 6–23)
CO2: 27 mEq/L (ref 19–32)
Calcium: 9.7 mg/dL (ref 8.4–10.5)
Chloride: 102 mEq/L (ref 96–112)
Creat: 0.85 mg/dL (ref 0.50–1.10)
Glucose, Bld: 82 mg/dL (ref 70–99)
Potassium: 4.2 mEq/L (ref 3.5–5.3)
Sodium: 140 mEq/L (ref 135–145)
Total Bilirubin: 0.3 mg/dL (ref 0.3–1.2)
Total Protein: 6.7 g/dL (ref 6.0–8.3)

## 2012-05-21 NOTE — Progress Notes (Signed)
INFECTIOUS DISEASES CLINIC  RFV: follow up for latent TB treatment  Subjective:    Patient ID: Wendy Fletcher, female    DOB: [birth year], 61 y.o.   MRN: [id number]  HPI 61 yo F with psoriasis/psoriatic arthritis previously on enbrel. Had recent + quantiferon and referred to ID clinic to seek treatment for latent TB. She tried to do 12 week regimen but had significant SE on the 3rd and 4th week of dosing. ILI with fevers and malaise that would resolve after 5 days. Initially thought to be viral illness, but then symptoms occurred again after rechallenge on the 4th dose. She had blood work that showed mild transaminitis which returned to baseline. She has discontinued INH and rifapentin and switched to rifampin 4 wks ago . She is feeling better, not run down. Although, her   psoriasis still persists, getting worse on calves, elbows. Overall, it is not worse than our last month visit but definitely not present when she first started her latent TB treatment. For her Psoriasis rash she uses steroid cream BID/sunlight.She feels that she may also have psoriatic arthritis. Her right knee had developed an effusion, had an arthrocentesis, MRI of knee that was normal, and received steroid injection via Drs. Eulah Pont and Wanship in the last 2 weeks. She has had a spontaneous knee effusion in the past, 8 yrs ago, as well as, when she was off of enbrel 2 years ago, and had another flare. She has not been to a rheumatologist for evaluation.   Current Outpatient Prescriptions on File Prior to Visit  Medication Sig Dispense Refill  . atorvastatin (LIPITOR) 10 MG tablet Take 10 mg by mouth daily.      . cholecalciferol (VITAMIN D) 1000 UNITS tablet Take 1,000 Units by mouth daily.      Marland Kitchen etanercept (ENBREL) 50 MG/ML injection Inject 50 mg into the skin once a week. Twice      . levothyroxine (SYNTHROID, LEVOTHROID) 50 MCG tablet Take 50 mcg by mouth daily.      . sertraline (ZOLOFT) 100 MG tablet Take 200 mg by mouth.  Takes 200 mg daily      . zolpidem (AMBIEN) 5 MG tablet Take 10 mg by mouth at bedtime as needed. Takes 1/2 tablet qhs      . Ospemifene (OSPHENA) 60 MG TABS Take 60 mg by mouth daily.      . promethazine (PHENERGAN) 12.5 MG tablet Take 1 tablet (12.5 mg total) by mouth every 6 (six) hours as needed for nausea.  30 tablet  0   Active Ambulatory Problems    Diagnosis Date Noted  . 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 Psoriasis 03/18/2012   Resolved Ambulatory Problems    Diagnosis Date Noted  . No Resolved Ambulatory Problems   Past Medical History  Diagnosis Date  . Dyslipidemia   . Insomnia   . Palpitations   . FHx: Alzheimer's disease       Review of Systems   Constitutional: Negative for fever, chills, diaphoresis, activity change, appetite change, fatigue and unexpected weight change.  HENT: Negative for congestion, sore throat, rhinorrhea, sneezing, trouble swallowing and sinus pressure.  Eyes: Negative for photophobia and visual disturbance.  Respiratory: Negative for cough, chest tightness, shortness of breath, wheezing and stridor.  Cardiovascular: Negative for chest pain, palpitations and leg swelling.  Gastrointestinal: Negative for nausea, vomiting, abdominal pain, diarrhea, constipation, blood in stool, abdominal distention and anal bleeding.  Genitourinary: Negative for dysuria, hematuria, flank pain and difficulty urinating.  Musculoskeletal: right knee effusion and ache now improved.Negative for myalgias, back pain, joint swelling, arthralgias and gait problem.  Skin: rash per hpi Neurological: Negative for dizziness, tremors, weakness and light-headedness.  Hematological: Negative for adenopathy. Does not bruise/bleed easily.  Psychiatric/Behavioral: Negative for behavioral problems, confusion, sleep disturbance, dysphoric mood, decreased concentration and agitation.       Objective:   Physical Exam  BP 135/73  Pulse 89  Temp 97.6 F (36.4 C) (Oral)  Wt 159 lb  (72.122 kg) Gen= A x O by 3 in NAD, HEENT = no scleral icterus, no thrush Pulm= CTAB, no w/c/r Cors= nl s1,s2, no g/m/r Skin = scattered raised red plaques, rough to legs and calves bilaterally, and elbows bilaterally Labs:  CMP     Component Value Date/Time   NA 137 05/03/2012 1000   K 4.4 05/03/2012 1000   CL 101 05/03/2012 1000   CO2 26 05/03/2012 1000   GLUCOSE 125* 05/03/2012 1000   BUN 16 05/03/2012 1000   CREATININE 0.88 05/03/2012 1000   CALCIUM 9.5 05/03/2012 1000   PROT 6.7 05/03/2012 1000   ALBUMIN 4.4 05/03/2012 1000   AST 23 05/03/2012 1000   ALT 30 05/03/2012 1000   ALKPHOS 49 05/03/2012 1000   BILITOT 0.5 05/03/2012 1000         Assessment & Plan:  Latent TB = She is mid way in the treatment of latent Tb (she finished 4 wks of inh-rifapentin) and now the remainder of her course with rifampin. In total, she has finished 2 of 4 months of therapy. She Will take rifampin til dec 3rd. Recheck CMP today, check for LFT abnormalities  Psoriasis = since it appears that her psoriasis is still persistent, will refer back to dermatology to decide when to start therapy. Since she has started her latent TB therapy and doing well, it would be safe to consider restarting psoriasis treatment in early November.  Psoriasis arthritis? = will refer to rheumatology for evaluation  Health maintenance = will give flu shot today   Cc:Hope gruber

## 2012-08-28 ENCOUNTER — Telehealth: Payer: Self-pay | Admitting: *Deleted

## 2012-08-28 NOTE — Telephone Encounter (Signed)
Wendy Fletcher from Dr Nino Parsley office called and asked about this patient. She advised that we treated the patient for a positive PPD and she is done with treatment. Now the patient is back on Enbrel and will need to have yearly PPD test. Dr Danella Deis wants to know what the best way to test her is Chest xray vs Quantiferon Gold vs TB skin test? Advised her will send Dr Drue Second a note and call back with the response. She left [phone] as the call back.

## 2012-09-09 ENCOUNTER — Telehealth: Payer: Self-pay | Admitting: *Deleted

## 2012-09-09 NOTE — Telephone Encounter (Signed)
I returned a call to Rosey Bath at Dr Nino Parsley office about this patient PPD. Advised her Dr Drue Second recommends Chest X-rays and check the patient symptoms. As the [employer] as well as the skin test will always react and therefore are not accurate. Had to leave a message and I apologized for the delay in response.

## 2013-01-14 ENCOUNTER — Encounter: Payer: Self-pay | Admitting: Nurse Practitioner

## 2013-02-06 ENCOUNTER — Ambulatory Visit: Payer: BC Managed Care – PPO | Admitting: Nurse Practitioner

## 2013-02-07 ENCOUNTER — Encounter: Payer: Self-pay | Admitting: *Deleted

## 2013-02-18 ENCOUNTER — Ambulatory Visit (INDEPENDENT_AMBULATORY_CARE_PROVIDER_SITE_OTHER): Payer: BC Managed Care – PPO | Admitting: Nurse Practitioner

## 2013-02-18 ENCOUNTER — Encounter: Payer: Self-pay | Admitting: Nurse Practitioner

## 2013-02-18 VITALS — BP 116/68 | HR 74 | Resp 12 | Ht 65.0 in | Wt 149.0 lb

## 2013-02-18 DIAGNOSIS — Z0000 Encounter for general adult medical examination without abnormal findings: Secondary | ICD-10-CM

## 2013-02-18 DIAGNOSIS — Z1211 Encounter for screening for malignant neoplasm of colon: Secondary | ICD-10-CM

## 2013-02-18 DIAGNOSIS — Z01419 Encounter for gynecological examination (general) (routine) without abnormal findings: Secondary | ICD-10-CM

## 2013-02-18 LAB — POCT URINALYSIS DIPSTICK
Leukocytes, UA: NEGATIVE
Spec Grav, UA: 1.015
Urobilinogen, UA: NEGATIVE
pH, UA: 5.5

## 2013-02-18 LAB — HEMOGLOBIN, FINGERSTICK: Hemoglobin, fingerstick: 12 g/dL (ref 12.0–16.0)

## 2013-02-18 MED ORDER — ESTRADIOL 0.1 MG/GM VA CREA: 2.0000 g | TOPICAL_CREAM | Freq: Every day | VAGINAL | Status: DC

## 2013-02-18 MED ORDER — ZOLPIDEM TARTRATE 10 MG PO TABS: 10.0000 mg | ORAL_TABLET | Freq: Every evening | ORAL | Status: DC | PRN

## 2013-02-18 MED ORDER — VALACYCLOVIR HCL 500 MG PO TABS: 500.0000 mg | ORAL_TABLET | Freq: Two times a day (BID) | ORAL | Status: DC

## 2013-02-18 MED ORDER — ESTRADIOL 10 MCG VA TABS: 1.0000 | ORAL_TABLET | VAGINAL | Status: DC

## 2013-02-18 NOTE — Patient Instructions (Addendum)

## 2013-02-18 NOTE — Progress Notes (Signed)
62 y.o. G0P0 Married Caucasian Fe here for annual exam.  No further episodes of back skin 'pain'. She saw dermatologist and was not diagnosed as shingles but some type of ?  Neuropathy. She tried Osphenia and found that it was not helpful for vaginal dryness.  Still a lot of discomfort with sexual activity.  She remains very active and has just returned from trip to [place].  She saw PCP and took a course of Bactrim for UTI, but culture was negative. Finished last tab this am and still having some urethral pain and pressure. She is scheduled to return to see PCP.  No LMP recorded. Patient is postmenopausal.          Sexually active: yes  The current method of family planning is post menopausal status.    Exercising: yes  walk 3 miles, 3-5x weekly Smoker:  no  Health Maintenance: Pap:  02/05/2012  Normal with negative HR HPV MMG: 03/25/2012 normal Colonoscopy:  10/10/2005 repeat in 10 years,  IFOB kit given today BMD:   11/2007 normal TDaP:  12/2006 Labs: Hgb- 12.0    reports that she has never smoked. She has never used smokeless tobacco. She reports that she drinks about 7.5 ounces of alcohol per week. She reports that she does not use illicit drugs.  Past Medical History  Diagnosis Date  . Dyslipidemia   . Psoriasis   . Insomnia     long Hx of who presents for evaluation of palpitations and chest discomfort  . Palpitations   . FHx: Alzheimer's disease     Positive  . Thyroid disease     hypothyroidism  . Menorrhagia   . Psoriasis   . TB lung, latent 03/2012    Past Surgical History  Procedure Laterality Date  . Tonsillectomy      Current Outpatient Prescriptions  Medication Sig Dispense Refill  . atorvastatin (LIPITOR) 10 MG tablet Take 10 mg by mouth daily.      Marland Kitchen buPROPion (WELLBUTRIN XL) 150 MG 24 hr tablet Take 150 mg by mouth daily.      Marland Kitchen etanercept (ENBREL) 50 MG/ML injection Inject 50 mg into the skin once a week. Twice      . levothyroxine (SYNTHROID,  LEVOTHROID) 50 MCG tablet Take 50 mcg by mouth daily.      . sertraline (ZOLOFT) 100 MG tablet Take 200 mg by mouth. Takes 200 mg daily      . zolpidem (AMBIEN) 5 MG tablet Take 10 mg by mouth at bedtime as needed. Takes 1/2 tablet qhs      . promethazine (PHENERGAN) 12.5 MG tablet Take 1 tablet (12.5 mg total) by mouth every 6 (six) hours as needed for nausea.  30 tablet  0   No current facility-administered medications for this visit.    Family History  Problem Relation Age of Onset  . Thyroid disease Mother   . Diabetes Father     addm  . Thyroid disease Father     ROS:  Pertinent items are noted in HPI.  Otherwise, a comprehensive ROS was negative.  Exam:   BP 116/68  Pulse 74  Resp 12  Ht 5\' 5"  (1.651 m)  Wt 149 lb (67.586 kg)  BMI 24.79 kg/m2 Height: 5\' 5"  (165.1 cm)  Ht Readings from Last 3 Encounters:  02/18/13 5\' 5"  (1.651 m)  03/15/12 5' 4.5" (1.638 m)    General appearance: alert, cooperative and appears stated age Head: Normocephalic, without obvious abnormality, atraumatic  Neck: no adenopathy, supple, symmetrical, trachea midline and thyroid normal to inspection and palpation Lungs: clear to auscultation bilaterally Breasts: normal appearance, no masses or tenderness Heart: regular rate and rhythm Abdomen: soft, non-tender; no masses,  no organomegaly Extremities: extremities normal, atraumatic, no cyanosis or edema Skin: Skin color, texture, turgor normal. No rashes or lesions Lymph nodes: Cervical, supraclavicular, and axillary nodes normal. No abnormal inguinal nodes palpated Neurologic: Grossly normal   Pelvic: External genitalia:  no lesions              Urethra:  Atrophic appearing urethra with no masses, tenderness or lesions              Bartholin's and Skene's: normal                 Vagina: atrophic appearing vagina with pale color and no discharge, no lesions              Cervix: anteverted              Pap taken: no Bimanual Exam:  Uterus:   normal size, contour, position, consistency, mobility, non-tender              Adnexa: no mass, fullness, tenderness               Rectovaginal: Confirms               Anus:  normal sphincter tone, no lesions  A:  Well Woman with normal exam  Recent Bactrim for UTI- finished med's today  Atrophic vaginitis no help with Osphenia  Urethral prolapse - may be cause of urinary symptoms  P:   Pap smear as per guidelines   Mammogram due 03/2013  IFOB kit given  Sample of Vagifem at hs for 10 - 14 days, then 2 times weekly.  If she does  well with samples to call back.  Sample also of Estrace vaginal cream to use 'pea' size at there urethra 2  times weekly  Reviewed potential risk and benefits of ERT including CVA, DVT, cancer, etc  Counseled on breast self exam, menopause, adequate intake of calcium and  vitamin D, diet and exercise, Kegel's exercises return annually or prn  An After Visit Summary was printed and given to the patient.

## 2013-02-22 NOTE — Progress Notes (Signed)
Encounter reviewed by Dr. Kariel Skillman Silva.  

## 2013-03-06 ENCOUNTER — Telehealth: Payer: Self-pay | Admitting: Nurse Practitioner

## 2013-03-06 NOTE — Telephone Encounter (Signed)
Dr. Nino Parsley office calling needing copy of most recent labs faxed please to [phone] please.

## 2013-04-07 ENCOUNTER — Other Ambulatory Visit: Payer: Self-pay | Admitting: Nurse Practitioner

## 2013-04-07 DIAGNOSIS — Z01419 Encounter for gynecological examination (general) (routine) without abnormal findings: Secondary | ICD-10-CM

## 2013-04-07 MED ORDER — ESTRADIOL 10 MCG VA TABS: 1.0000 | ORAL_TABLET | VAGINAL | Status: DC

## 2013-04-07 NOTE — Telephone Encounter (Signed)
Ok to refill Vagifem at 2 X weekly for a year

## 2013-04-07 NOTE — Telephone Encounter (Signed)
Patient was given samples of Vagi fem and now would like to have a prescription called in.  Please call in to [facility] .

## 2013-04-07 NOTE — Telephone Encounter (Signed)
In aex note on 7/14 pt was given samples & told to use it at night for 10-14 days then 2 times weekly & call us back if she does well. Please approve rx since pt wants to continue

## 2013-04-17 LAB — FECAL OCCULT BLOOD, IMMUNOCHEMICAL: Fecal Occult Blood: NEGATIVE

## 2013-04-17 NOTE — Addendum Note (Signed)
Addended by: Luisa Dago on: 04/17/2013 03:33 PM   Modules accepted: Orders

## 2013-05-21 ENCOUNTER — Other Ambulatory Visit: Payer: Self-pay

## 2013-05-21 DIAGNOSIS — Z1231 Encounter for screening mammogram for malignant neoplasm of breast: Secondary | ICD-10-CM

## 2013-06-04 ENCOUNTER — Ambulatory Visit
Admission: RE | Admit: 2013-06-04 | Discharge: 2013-06-04 | Disposition: A | Discharge status: Home / Self Care | Payer: BC Managed Care – PPO | Source: Ambulatory Visit

## 2013-06-04 DIAGNOSIS — Z1231 Encounter for screening mammogram for malignant neoplasm of breast: Secondary | ICD-10-CM

## 2013-06-19 ENCOUNTER — Other Ambulatory Visit: Payer: Self-pay

## 2013-08-21 ENCOUNTER — Other Ambulatory Visit: Payer: Self-pay | Admitting: Nurse Practitioner

## 2013-08-21 NOTE — Telephone Encounter (Signed)
eScribe request for refill on ZOLPIDEM Last filled - 02/18/13,#30 X 5 RF Last AEX - 02/18/13 Next AEX - 02/24/14 P. Grubb pt.  Please advise refills.

## 2013-08-21 NOTE — Telephone Encounter (Signed)
30 day supply called [facility] per Ortencia Kick. Leonard verbal OK.

## 2013-10-22 ENCOUNTER — Other Ambulatory Visit: Payer: Self-pay | Admitting: Nurse Practitioner

## 2013-10-22 NOTE — Telephone Encounter (Signed)
Last AEX 02/18/2013 Last refill 08/21/2013 #30/0 refills Next appt 02/24/14  Please approve or deny rx.

## 2014-02-11 ENCOUNTER — Other Ambulatory Visit: Payer: Self-pay | Admitting: Nurse Practitioner

## 2014-02-11 NOTE — Telephone Encounter (Signed)
Last AEX 02/18/13 Last refill 10/22/13 #30/1 refill Next appt 02/24/13  Please approve or deny Rx.

## 2014-02-11 NOTE — Telephone Encounter (Signed)
She can discuss use with Patty at aex Rx denied

## 2014-02-17 ENCOUNTER — Other Ambulatory Visit: Payer: Self-pay | Admitting: Nurse Practitioner

## 2014-02-17 NOTE — Telephone Encounter (Signed)
Patient called during lunch about rx. Called pt back todl her of the status of refill. Advised someone would give her a call once they heard back from patty

## 2014-02-17 NOTE — Telephone Encounter (Signed)
Last AEX 02/18/13 Last refill 10/22/13 #30/1 refill Next appt 02/24/14  Please approve or deny Rx

## 2014-02-18 NOTE — Telephone Encounter (Signed)
LM for pt re: [facility] [address]

## 2014-02-24 ENCOUNTER — Ambulatory Visit (INDEPENDENT_AMBULATORY_CARE_PROVIDER_SITE_OTHER): Payer: BC Managed Care – PPO | Admitting: Nurse Practitioner

## 2014-02-24 ENCOUNTER — Encounter: Payer: Self-pay | Admitting: Nurse Practitioner

## 2014-02-24 VITALS — BP 104/64 | HR 76 | Ht 64.5 in | Wt 151.0 lb

## 2014-02-24 DIAGNOSIS — R319 Hematuria, unspecified: Secondary | ICD-10-CM

## 2014-02-24 DIAGNOSIS — N952 Postmenopausal atrophic vaginitis: Secondary | ICD-10-CM

## 2014-02-24 DIAGNOSIS — E2839 Other primary ovarian failure: Secondary | ICD-10-CM

## 2014-02-24 DIAGNOSIS — Z0000 Encounter for general adult medical examination without abnormal findings: Secondary | ICD-10-CM

## 2014-02-24 DIAGNOSIS — Z01419 Encounter for gynecological examination (general) (routine) without abnormal findings: Secondary | ICD-10-CM

## 2014-02-24 DIAGNOSIS — G4700 Insomnia, unspecified: Secondary | ICD-10-CM

## 2014-02-24 DIAGNOSIS — Z1211 Encounter for screening for malignant neoplasm of colon: Secondary | ICD-10-CM

## 2014-02-24 LAB — POCT URINALYSIS DIPSTICK
Bilirubin, UA: NEGATIVE
Glucose, UA: NEGATIVE
Ketones, UA: NEGATIVE
Protein, UA: NEGATIVE
Urobilinogen, UA: NEGATIVE
pH, UA: 5

## 2014-02-24 LAB — HEMOGLOBIN, FINGERSTICK: Hemoglobin, fingerstick: 12.9 g/dL (ref 12.0–16.0)

## 2014-02-24 MED ORDER — ESTRADIOL 10 MCG VA TABS: 1.0000 | ORAL_TABLET | VAGINAL | Status: DC

## 2014-02-24 MED ORDER — ESTRADIOL 0.1 MG/GM VA CREA: TOPICAL_CREAM | VAGINAL | Status: DC

## 2014-02-24 MED ORDER — ZOLPIDEM TARTRATE 10 MG PO TABS: ORAL_TABLET | ORAL | Status: DC

## 2014-02-24 NOTE — Progress Notes (Signed)
Patient ID: Wendy LenisAnne B Fletcher, female   DOB: 05-26-1951, 63 y.o.   MRN: [id number] 63 y.o. G0P0 Married Caucasian Fe here for annual exam.  Went off Embrel in January and had an acute flare of right knee pain.  Then went on App[place]Otezla until May and now back on Embrel.  In the interim while on Mauritaniatezla had help with psoriasis but no help with arthritis.  Patient's last menstrual period was 08/14/2005.          Sexually active: yes  The current method of family planning is post menopausal status.  Exercising: yes walk 3 miles, twice weekly, weights at least twice weekly Smoker: no   Health Maintenance:  Pap: 02/05/2012 Normal with negative HR HPV  MMG: 06/04/13, Bi-Rads 1: negative  Colonoscopy: 10/10/2005 repeat in 10 years, IFOB is given BMD: 11/2007 normal  TDaP: 12/2006  Labs:  HB:  12.9  Urine:  2+ RBC, mod leuks   reports that she has never smoked. She has never used smokeless tobacco. She reports that she drinks about 7.5 ounces of alcohol per week. She reports that she does not use illicit drugs.  Past Medical History  Diagnosis Date  . Dyslipidemia   . Psoriasis   . Insomnia     long Hx of who presents for evaluation of palpitations and chest discomfort  . Palpitations   . FHx: Alzheimer's disease     Positive  . Thyroid disease     hypothyroidism  . Menorrhagia   . Psoriasis   . 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    Past Surgical History  Procedure Laterality Date  . Tonsillectomy  age 63  . Wisdom tooth extraction  age 63    Current Outpatient Prescriptions  Medication Sig Dispense Refill  . atorvastatin (LIPITOR) 10 MG tablet Take 10 mg by mouth daily.      Marland Kitchen. buPROPion (WELLBUTRIN XL) 150 MG 24 hr tablet Take 150 mg by mouth daily.      . Cholecalciferol (VITAMIN D3) 3000 UNITS TABS Take 1 tablet by mouth daily.      Marland Kitchen. estradiol (ESTRACE VAGINAL) 0.1 MG/GM vaginal cream Use as directed twice weekly  42.5 g  12  . Estradiol 10  MCG TABS vaginal tablet Place 1 tablet (10 mcg total) vaginally 2 (two) times a week.  24 tablet  3  . etanercept (ENBREL) 50 MG/ML injection Inject 50 mg into the skin once a week. Twice      . levothyroxine (SYNTHROID, LEVOTHROID) 50 MCG tablet Take 50 mcg by mouth daily.      . Multiple Vitamin (MULTIVITAMIN) tablet Take 1 tablet by mouth daily.      Marland Kitchen. omeprazole-sodium bicarbonate (ZEGERID) 40-1100 MG per capsule Take 1 capsule by mouth daily before breakfast.      . sertraline (ZOLOFT) 100 MG tablet Take 200 mg by mouth.       . valACYclovir (VALTREX) 500 MG tablet Take 1 tablet (500 mg total) by mouth 2 (two) times daily.  30 tablet  1  . zolpidem (AMBIEN) 10 MG tablet TAKE 1/2 TO 1 TABLET BY MOUTH AT BEDTIME AS NEEDED  30 tablet  5   No current facility-administered medications for this visit.    Family History  Problem Relation Age of Onset  . Thyroid disease Mother   . Diabetes Father     addm  . Thyroid disease Father   . Dementia Father   .  Thyroid disease Brother     ROS:  Pertinent items are noted in HPI.  Otherwise, a comprehensive ROS was negative.  Exam:   BP 104/64  Pulse 76  Ht 5' 4.5" (1.638 m)  Wt 151 lb (68.493 kg)  BMI 25.53 kg/m2  LMP 08/14/2005 Height: 5' 4.5" (163.8 cm)  Ht Readings from Last 3 Encounters:  02/24/14 5' 4.5" (1.638 m)  02/18/13 5\' 5"  (1.651 m)  03/15/12 5' 4.5" (1.638 m)    General appearance: alert, cooperative and appears stated age Head: Normocephalic, without obvious abnormality, atraumatic Neck: no adenopathy, supple, symmetrical, trachea midline and thyroid normal to inspection and palpation Lungs: clear to auscultation bilaterally Breasts: normal appearance, no masses or tenderness Heart: regular rate and rhythm Abdomen: soft, non-tender; no masses,  no organomegaly Extremities: extremities normal, atraumatic, no cyanosis or edema Skin: Skin color, texture, turgor normal. No rashes or lesions Lymph nodes: Cervical,  supraclavicular, and axillary nodes normal. No abnormal inguinal nodes palpated Neurologic: Grossly normal   Pelvic: External genitalia:  no lesions              Urethra:  normal appearing urethra with no masses, tenderness or lesions              Bartholin's and Skene's: normal                 Vagina: normal appearing vagina with normal color and discharge, no lesions              Cervix: anteverted              Pap taken: No. Bimanual Exam:  Uterus:  normal size, contour, position, consistency, mobility, non-tender              Adnexa: no mass, fullness, tenderness               Rectovaginal: Confirms               Anus:  normal sphincter tone, no lesions  1. Well woman exam with routine gynecological exam  Counseled on breast self exam, mammography screening, adequate intake of calcium and vitamin D, diet and exercise, Kegel's exercises return annually or prn    Reviewed health and wellness pertinent to exam  Pap smear not taken today  Mammogram is due 10/15    2. Laboratory examination ordered as part of a routine general medical examination - Hemoglobin, fingerstick - POCT urinalysis dipstick  3. Hematuria - Urine culture - Urine Microscopic  4. Special screening for malignant neoplasms, colon - Fecal occult blood, imunochemical; Future  5. 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 Insomnia - zolpidem (AMBIEN) 10 MG tablet; TAKE 1/2 TO 1 TABLET BY MOUTH AT BEDTIME AS NEEDED  Dispense: 30 tablet; Refill: 5  7. Atrophic vaginitis - estradiol (ESTRACE VAGINAL) 0.1 MG/GM vaginal cream; Use as directed twice weekly  Dispense: 42.5 g; Refill: 12 - Estradiol 10 MCG TABS vaginal tablet; Place 1 tablet (10 mcg total) vaginally 2 (two) times a week.  Dispense: 24 tablet; Refill: 3    An After Visit Summary was printed and given to the patient.

## 2014-02-24 NOTE — Patient Instructions (Signed)

## 2014-02-25 LAB — URINE CULTURE
Colony Count: NO GROWTH
Organism ID, Bacteria: NO GROWTH

## 2014-02-25 LAB — URINALYSIS, MICROSCOPIC ONLY
Bacteria, UA: NONE SEEN
Casts: NONE SEEN
Crystals: NONE SEEN

## 2014-02-26 NOTE — Progress Notes (Signed)
Note reviewed, agree with plan.  Reshaun Briseno, MD  

## 2014-10-01 ENCOUNTER — Other Ambulatory Visit: Payer: Self-pay

## 2014-10-01 DIAGNOSIS — Z1231 Encounter for screening mammogram for malignant neoplasm of breast: Secondary | ICD-10-CM

## 2014-10-10 ENCOUNTER — Other Ambulatory Visit: Payer: Self-pay | Admitting: Nurse Practitioner

## 2014-10-12 NOTE — Telephone Encounter (Signed)
Medication refill request: Ambien 10 mg  Last AEX: 02/24/14 with PG  Next AEX: 03/25/15 with PG Last MMG (if hormonal medication request): N/A Refill authorized: #[id number] rfs,please advise.

## 2014-10-12 NOTE — Telephone Encounter (Signed)
Rx printed, signed by Ms. Patty [facility] [address]

## 2014-10-13 ENCOUNTER — Ambulatory Visit: Payer: Self-pay

## 2014-10-20 ENCOUNTER — Ambulatory Visit
Admission: RE | Admit: 2014-10-20 | Discharge: 2014-10-20 | Disposition: A | Discharge status: Home / Self Care | Payer: BLUE CROSS/BLUE SHIELD | Source: Ambulatory Visit

## 2014-10-20 ENCOUNTER — Other Ambulatory Visit: Payer: Self-pay

## 2014-10-20 DIAGNOSIS — Z1231 Encounter for screening mammogram for malignant neoplasm of breast: Secondary | ICD-10-CM

## 2015-03-25 ENCOUNTER — Ambulatory Visit: Payer: BC Managed Care – PPO | Admitting: Obstetrics & Gynecology

## 2015-03-25 ENCOUNTER — Ambulatory Visit: Payer: BC Managed Care – PPO | Admitting: Nurse Practitioner

## 2015-04-07 ENCOUNTER — Encounter: Payer: Self-pay | Admitting: Nurse Practitioner

## 2015-04-07 ENCOUNTER — Other Ambulatory Visit: Payer: Self-pay | Admitting: Nurse Practitioner

## 2015-04-07 ENCOUNTER — Ambulatory Visit (INDEPENDENT_AMBULATORY_CARE_PROVIDER_SITE_OTHER): Payer: BLUE CROSS/BLUE SHIELD | Admitting: Nurse Practitioner

## 2015-04-07 VITALS — BP 110/60 | HR 98 | Resp 16 | Ht 64.5 in | Wt 154.0 lb

## 2015-04-07 DIAGNOSIS — Z01419 Encounter for gynecological examination (general) (routine) without abnormal findings: Secondary | ICD-10-CM

## 2015-04-07 DIAGNOSIS — E2839 Other primary ovarian failure: Secondary | ICD-10-CM | POA: Diagnosis not present

## 2015-04-07 DIAGNOSIS — L4050 Arthropathic psoriasis, unspecified: Secondary | ICD-10-CM | POA: Diagnosis not present

## 2015-04-07 DIAGNOSIS — Z0000 Encounter for general adult medical examination without abnormal findings: Secondary | ICD-10-CM

## 2015-04-07 LAB — POCT URINALYSIS DIPSTICK
Leukocytes, UA: NEGATIVE
Urobilinogen, UA: NEGATIVE
pH, UA: 5

## 2015-04-07 LAB — CBC WITH DIFFERENTIAL/PLATELET
Basophils Absolute: 0.1 10*3/uL (ref 0.0–0.1)
Basophils Relative: 1 % (ref 0–1)
Eosinophils Absolute: 0.1 10*3/uL (ref 0.0–0.7)
Eosinophils Relative: 1 % (ref 0–5)
HCT: 36.7 % (ref 36.0–46.0)
Hemoglobin: 12.5 g/dL (ref 12.0–15.0)
Lymphocytes Relative: 16 % (ref 12–46)
Lymphs Abs: 1 10*3/uL (ref 0.7–4.0)
MCH: 31.3 pg (ref 26.0–34.0)
MCHC: 34.1 g/dL (ref 30.0–36.0)
MCV: 91.8 fL (ref 78.0–100.0)
MPV: 9.2 fL (ref 8.6–12.4)
Monocytes Absolute: 0.3 10*3/uL (ref 0.1–1.0)
Monocytes Relative: 5 % (ref 3–12)
Neutro Abs: 4.9 10*3/uL (ref 1.7–7.7)
Neutrophils Relative %: 77 % (ref 43–77)
Platelets: 203 10*3/uL (ref 150–400)
RBC: 4 MIL/uL (ref 3.87–5.11)
RDW: 13.5 % (ref 11.5–15.5)
WBC: 6.3 10*3/uL (ref 4.0–10.5)

## 2015-04-07 LAB — COMPREHENSIVE METABOLIC PANEL
ALT: 27 U/L (ref 6–29)
AST: 24 U/L (ref 10–35)
Albumin: 4.6 g/dL (ref 3.6–5.1)
Alkaline Phosphatase: 54 U/L (ref 33–130)
BUN: 21 mg/dL (ref 7–25)
CO2: 26 mmol/L (ref 20–31)
Calcium: 9.1 mg/dL (ref 8.6–10.4)
Chloride: 103 mmol/L (ref 98–110)
Creat: 0.94 mg/dL (ref 0.50–0.99)
Glucose, Bld: 108 mg/dL — ABNORMAL HIGH (ref 65–99)
Potassium: 4.6 mmol/L (ref 3.5–5.3)
Sodium: 139 mmol/L (ref 135–146)
Total Bilirubin: 0.4 mg/dL (ref 0.2–1.2)
Total Protein: 6.7 g/dL (ref 6.1–8.1)

## 2015-04-07 LAB — LIPID PANEL
Cholesterol: 210 mg/dL — ABNORMAL HIGH (ref 125–200)
HDL: 64 mg/dL (ref >=46)
LDL Cholesterol: 124 mg/dL (ref <130)
Total CHOL/HDL Ratio: 3.3 Ratio (ref <=5.0)
Triglycerides: 108 mg/dL (ref <150)
VLDL: 22 mg/dL (ref <30)

## 2015-04-07 LAB — HEMOGLOBIN, FINGERSTICK: Hemoglobin, fingerstick: 12.7 g/dL (ref 12.0–16.0)

## 2015-04-07 LAB — TSH: TSH: 5.882 u[IU]/mL — ABNORMAL HIGH (ref 0.350–4.500)

## 2015-04-07 MED ORDER — ESTRADIOL 10 MCG VA TABS: 1.0000 | ORAL_TABLET | VAGINAL | Status: DC

## 2015-04-07 MED ORDER — ESTRADIOL 0.1 MG/GM VA CREA: TOPICAL_CREAM | VAGINAL | Status: DC

## 2015-04-07 NOTE — Patient Instructions (Addendum)

## 2015-04-07 NOTE — Progress Notes (Signed)
64 y.o. G0P0 Married  Caucasian Fe here for annual exam.  Off Enbrel and now on Cosentyx for psoriatic arthritis.  She sees Dr. Dierdre Forth and is very pleased with his care.  She still uses the Vagifem intravaginal the Estrace vaginal cream at the urethra. No pain with SA.  Patient's last menstrual period was 12/06/2005.          Sexually active: Yes.    The current method of family planning is post menopausal status.    Exercising: Yes.    walking, weights 5x/2x/wk Smoker:  no  Health Maintenance: Pap:  02/05/12 nl neg HR HPV MMG:  10/20/14 bi-rads 1: negative  Colonoscopy:  10/10/2005 normal f/u due in 2017 BMD:  11/27/2007 T-Score -0.0 Left Hip Neck/ 0.3 Left Forearm TDaP:  12/2006 Labs: Hgb: 12.7 , Urine: rbc's + - no symptoms   reports that she has never smoked. She has never used smokeless tobacco. She reports that she drinks about 9.0 oz of alcohol per week. She reports that she does not use illicit drugs.  Past Medical History  Diagnosis Date  . Dyslipidemia   . Psoriasis   . Insomnia     long Hx of who presents for evaluation of palpitations and chest discomfort  . Palpitations   . FHx: Alzheimer's disease     Positive  . Thyroid disease     hypothyroidism  . Menorrhagia   . Psoriasis   . 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    Past Surgical History  Procedure Laterality Date  . Tonsillectomy  age 70  . Wisdom tooth extraction  age 68    Current Outpatient Prescriptions  Medication Sig Dispense Refill  . atorvastatin (LIPITOR) 10 MG tablet Take 10 mg by mouth daily.    Marland Kitchen buPROPion (WELLBUTRIN XL) 150 MG 24 hr tablet Take 150 mg by mouth daily.    Lorenso Quarry SENSOREADY PEN 150 MG/ML SOAJ   5  . estradiol (ESTRACE VAGINAL) 0.1 MG/GM vaginal cream Use as directed twice weekly 42.5 g 3  . Estradiol 10 MCG TABS vaginal tablet Place 1 tablet (10 mcg total) vaginally 2 (two) times a week. 24 tablet 3  . levothyroxine (SYNTHROID,  LEVOTHROID) 50 MCG tablet Take 50 mcg by mouth daily.    . meloxicam (MOBIC) 15 MG tablet Take 15 mg by mouth daily.  0  . omeprazole-sodium bicarbonate (ZEGERID) 40-1100 MG per capsule Take 1 capsule by mouth daily before breakfast.    . sertraline (ZOLOFT) 100 MG tablet Take 200 mg by mouth.     . valACYclovir (VALTREX) 500 MG tablet Take 1 tablet (500 mg total) by mouth 2 (two) times daily. (Patient taking differently: Take 500 mg by mouth as needed. ) 30 tablet 1  . zolpidem (AMBIEN) 10 MG tablet take 1/2 to 1 tablet by mouth at bedtime if needed 30 tablet 5   No current facility-administered medications for this visit.    Family History  Problem Relation Age of Onset  . Thyroid disease Mother   . Diabetes Father     addm  . Thyroid disease Father   . Dementia Father   . Thyroid disease Brother     ROS:  Pertinent items are noted in HPI.  Otherwise, a comprehensive ROS was negative.  Exam:   BP 110/60 mmHg  Pulse 98  Resp 16  Ht 5' 4.5" (1.638 m)  Wt 154 lb (69.854 kg)  BMI 26.04 kg/m2  LMP 12/06/2005 Height: 5' 4.5" (163.8 cm) Ht Readings from Last 3 Encounters:  04/07/15 5' 4.5" (1.638 m)  02/24/14 5' 4.5" (1.638 m)  02/18/13 5\' 5"  (1.651 m)    General appearance: alert, cooperative and appears stated age Head: Normocephalic, without obvious abnormality, atraumatic Neck: no adenopathy, supple, symmetrical, trachea midline and thyroid normal to inspection and palpation Lungs: clear to auscultation bilaterally Breasts: normal appearance, no masses or tenderness Heart: regular rate and rhythm Abdomen: soft, non-tender; no masses,  no organomegaly Extremities: extremities normal, atraumatic, no cyanosis or edema Skin: Skin color, texture, turgor normal. No rashes or lesions Lymph nodes: Cervical, supraclavicular, and axillary nodes normal. No abnormal inguinal nodes palpated Neurologic: Grossly normal   Pelvic: External genitalia:  no lesions              Urethra:   normal appearing urethra with no masses, tenderness or lesions              Bartholin's and Skene's: normal                 Vagina: normal appearing vagina with normal color and discharge, no lesions              Cervix: anteverted              Pap taken: Yes.   Bimanual Exam:  Uterus:  normal size, contour, position, consistency, mobility, non-tender              Adnexa: no mass, fullness, tenderness               Rectovaginal: Confirms               Anus:  normal sphincter tone, no lesions  Chaperone present: yes  A:  Well Woman with normal exam  Postmenopausal   Atrophic vaginitis and urethritis - better on vaginal estrogen  Insomnia   Psoriatic arthritis   P:   Reviewed health and wellness pertinent to exam  Pap smear as above  Mammogram is due 3/17  Refill on Vagifem 2 X week and Estrace cream to the urethra pea size amount 2 x week.  Counseled with risk of DVT, CVA, cancer  Did not put a refill of Ambien in at this time - not sure needed yet  Counseled on breast self exam, mammography screening, use and side effects of HRT, adequate intake of calcium and vitamin D, diet and exercise return annually or prn  An After Visit Summary was printed and given to the patient.

## 2015-04-08 LAB — VITAMIN D 25 HYDROXY (VIT D DEFICIENCY, FRACTURES): Vit D, 25-Hydroxy: 37 ng/mL (ref 30–100)

## 2015-04-08 NOTE — Progress Notes (Signed)
Encounter reviewed by Dr. Whitaker Holderman Amundson C. Silva.  

## 2015-04-09 LAB — HEMOGLOBIN A1C
Hgb A1c MFr Bld: 5.6 % (ref <5.7)
Mean Plasma Glucose: 114 mg/dL (ref <117)

## 2015-04-09 LAB — IPS PAP TEST WITH HPV

## 2015-10-24 ENCOUNTER — Encounter: Payer: Self-pay | Admitting: Family Medicine

## 2015-10-24 ENCOUNTER — Ambulatory Visit (INDEPENDENT_AMBULATORY_CARE_PROVIDER_SITE_OTHER): Payer: BLUE CROSS/BLUE SHIELD | Admitting: Family Medicine

## 2015-10-24 VITALS — BP 122/74 | HR 91 | Temp 98.9°F | Resp 17 | Ht 63.0 in | Wt 158.0 lb

## 2015-10-24 DIAGNOSIS — J209 Acute bronchitis, unspecified: Secondary | ICD-10-CM

## 2015-10-24 MED ORDER — HYDROCODONE-HOMATROPINE 5-1.5 MG/5ML PO SYRP: 5.0000 mL | ORAL_SOLUTION | Freq: Three times a day (TID) | ORAL | Status: DC | PRN

## 2015-10-24 MED ORDER — ALBUTEROL SULFATE 108 (90 BASE) MCG/ACT IN AEPB: 2.0000 | INHALATION_SPRAY | Freq: Four times a day (QID) | RESPIRATORY_TRACT | Status: DC | PRN

## 2015-10-24 MED ORDER — AZITHROMYCIN 250 MG PO TABS: ORAL_TABLET | ORAL | Status: DC

## 2015-10-24 NOTE — Progress Notes (Signed)
° °  Subjective:    Patient ID: Wendy Fletcher, female    DOB: Jan 26, 1951, 65 y.o.   MRN: [id number] By signing my name below, I, Littie Deedsichard Sun, attest that this documentation has been prepared under the direction and in the presence of Elvina SidleKurt Lauenstein, MD.  Electronically Signed: Littie Deedsichard Sun, Medical Scribe. 10/24/2015. 12:27 PM.  HPI HPI Comments: Wendy Fletcher is a 65 y.o. female who presents to the [facility] complaining of gradual onset, dry cough that started 1 week ago. Patient reports having associated fatigue, voice change, diaphoresis, wheezing, and headache. She does not have any pain with coughing. The cough is worse at night. She takes Cosentyx for psoriatic arthritis and has been on this for a long time. Patient denies fever. She denies history of asthma.  Patient works in [employer].  Review of Systems  Constitutional: Positive for diaphoresis and fatigue. Negative for fever.  HENT: Positive for voice change.   Respiratory: Positive for cough and wheezing.   Neurological: Positive for headaches.       Objective:   Physical Exam CONSTITUTIONAL: Well developed/well nourished HEAD: Normocephalic/atraumatic EYES: EOM/PERRL ENMT: Mucous membranes moist NECK: supple no meningeal signs SPINE: entire spine nontender CV: S1/S2 noted, no murmurs/rubs/gallops noted LUNGS: Few expiratory wheezes bilaterally. ABDOMEN: soft, nontender, no rebound or guarding GU: no cva tenderness NEURO: Pt is awake/alert, moves all extremitiesx4 EXTREMITIES: pulses normal, full ROM SKIN: warm, color normal PSYCH: no abnormalities of mood noted        Assessment & Plan:   This chart was scribed in my presence and reviewed by me personally.    ICD-9-CM ICD-10-CM   1. Acute bronchitis, unspecified organism 466.0 J20.9 HYDROcodone-homatropine (HYCODAN) 5-1.5 MG/5ML syrup     azithromycin (ZITHROMAX) 250 MG tablet     Albuterol Sulfate (PROAIR RESPICLICK) 108 (90 Base) MCG/ACT AEPB      Signed, Elvina SidleKurt Lauenstein, MD

## 2015-10-24 NOTE — Patient Instructions (Signed)

## 2015-11-27 HISTORY — PX: CORNEAL TRANSPLANT: SHX108

## 2016-04-11 ENCOUNTER — Other Ambulatory Visit: Payer: Self-pay | Admitting: Nurse Practitioner

## 2016-04-11 ENCOUNTER — Encounter: Payer: Self-pay | Admitting: Nurse Practitioner

## 2016-04-11 ENCOUNTER — Ambulatory Visit (INDEPENDENT_AMBULATORY_CARE_PROVIDER_SITE_OTHER): Payer: BLUE CROSS/BLUE SHIELD | Admitting: Nurse Practitioner

## 2016-04-11 VITALS — BP 110/66 | HR 72 | Ht 63.75 in | Wt 152.0 lb

## 2016-04-11 DIAGNOSIS — E2839 Other primary ovarian failure: Secondary | ICD-10-CM | POA: Diagnosis not present

## 2016-04-11 DIAGNOSIS — R319 Hematuria, unspecified: Secondary | ICD-10-CM | POA: Diagnosis not present

## 2016-04-11 DIAGNOSIS — Z0000 Encounter for general adult medical examination without abnormal findings: Secondary | ICD-10-CM

## 2016-04-11 DIAGNOSIS — L4050 Arthropathic psoriasis, unspecified: Secondary | ICD-10-CM | POA: Diagnosis not present

## 2016-04-11 DIAGNOSIS — Z1231 Encounter for screening mammogram for malignant neoplasm of breast: Secondary | ICD-10-CM

## 2016-04-11 DIAGNOSIS — Z01419 Encounter for gynecological examination (general) (routine) without abnormal findings: Secondary | ICD-10-CM | POA: Diagnosis not present

## 2016-04-11 LAB — CBC WITH DIFFERENTIAL/PLATELET
Basophils Absolute: 56 cells/uL (ref 0–200)
Basophils Relative: 1 %
Eosinophils Absolute: 112 cells/uL (ref 15–500)
Eosinophils Relative: 2 %
HCT: 38.9 % (ref 35.0–45.0)
Hemoglobin: 12.7 g/dL (ref 11.7–15.5)
Lymphocytes Relative: 26 %
Lymphs Abs: 1456 cells/uL (ref 850–3900)
MCH: 29.9 pg (ref 27.0–33.0)
MCHC: 32.6 g/dL (ref 32.0–36.0)
MCV: 91.5 fL (ref 80.0–100.0)
MPV: 10.1 fL (ref 7.5–12.5)
Monocytes Absolute: 336 cells/uL (ref 200–950)
Monocytes Relative: 6 %
Neutro Abs: 3640 cells/uL (ref 1500–7800)
Neutrophils Relative %: 65 %
Platelets: 212 10*3/uL (ref 140–400)
RBC: 4.25 MIL/uL (ref 3.80–5.10)
RDW: 14.4 % (ref 11.0–15.0)
WBC: 5.6 10*3/uL (ref 3.8–10.8)

## 2016-04-11 LAB — POCT URINALYSIS DIPSTICK
Bilirubin, UA: NEGATIVE
Glucose, UA: NEGATIVE
Ketones, UA: NEGATIVE
Leukocytes, UA: NEGATIVE
Nitrite, UA: NEGATIVE
Protein, UA: NEGATIVE
Urobilinogen, UA: NEGATIVE
pH, UA: 5

## 2016-04-11 NOTE — Progress Notes (Signed)
Patient ID: Wendy Fletcher, female   DOB: 1950-09-01, 65 y.o.   MRN: [id number]  65 y.o. G0P0000 Married  Caucasian Fe here for annual exam.  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  Treated at [facility]. She had incredible pain and was on pain med's. She was in a fetal position and trying to have enough energy to make it through the day. [facility]  Off Cosentyx for 5 months - will be seeing Dermatologist for other choices.  Mother and father doing well.  Patient's last menstrual period was 12/06/2005.          Sexually active: Yes.    The current method of family planning is post menopausal status.    Exercising: Yes.    walking Smoker:  no  Health Maintenance: Pap: 04/07/15, Negative with negative HR HPV MMG: 10/20/14, Bi-Rads 1: Negative  Colonoscopy:  10/10/2005 repeat in 10 years; scheduled for 05/22/16 BMD:   11/27/07, T-Score -0.0 Left Hip Neck / 0.3 Left Forearm TDaP:  12/2006 Shingles: Unable to take live vaccine due to medication Pneumonia: Not indicated due to age Hep C and HIV: done today Labs: HB: 13.0  Urine: Moderate RBC   reports that she has never smoked. She has never used smokeless tobacco. She reports that she drinks about 9.0 oz of alcohol per week . She reports that she does not use drugs.  Past Medical History:  Diagnosis Date  . Dyslipidemia   . FHx: Alzheimer's disease    Positive  . Insomnia    long Hx of who presents for evaluation of palpitations and chest discomfort  . Menorrhagia   . Palpitations   . Psoriasis   . Psoriasis   . 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  . Thyroid disease    hypothyroidism    Past Surgical History:  Procedure Laterality Date  . TONSILLECTOMY  age 65  . WISDOM TOOTH EXTRACTION  age 65    Current Outpatient  Prescriptions  Medication Sig Dispense Refill  . Albuterol Sulfate (PROAIR RESPICLICK) 108 (90 Base) MCG/ACT AEPB Inhale 2 puffs into the lungs every 6 (six) hours as needed. 1 each 3  . atorvastatin (LIPITOR) 10 MG tablet Take 10 mg by mouth daily.    Marland Kitchen. azithromycin (ZITHROMAX) 250 MG tablet Take 2 tabs PO x 1 dose, then 1 tab PO QD x 4 days 6 tablet 0  . buPROPion (WELLBUTRIN XL) 150 MG 24 hr tablet Take 150 mg by mouth daily.    Lorenso Quarry. COSENTYX SENSOREADY PEN 150 MG/ML SOAJ   5  . HYDROcodone-homatropine (HYCODAN) 5-1.5 MG/5ML syrup Take 5 mLs by mouth every 8 (eight) hours as needed for cough. 120 mL 0  . levothyroxine (SYNTHROID, LEVOTHROID) 50 MCG tablet Take 50 mcg by mouth daily.    Marland Kitchen. omeprazole-sodium bicarbonate (ZEGERID) 40-1100 MG per capsule Take 1 capsule by mouth daily before breakfast.    . sertraline (ZOLOFT) 100 MG tablet Take 200 mg by mouth.     . zolpidem (AMBIEN) 10 MG tablet take 1/2 to 1 tablet by mouth at bedtime if needed 30 tablet 5   No current facility-administered medications for this visit.     Family History  Problem Relation Age of Onset  .  Thyroid disease Mother   . Diabetes Father     addm  . Thyroid disease Father   . Dementia Father   . Thyroid disease Brother     ROS:  Pertinent items are noted in HPI.  Otherwise, a comprehensive ROS was negative.  Exam:   LMP 12/06/2005    Ht Readings from Last 3 Encounters:  10/24/15 5\' 3"  (1.6 m)  04/07/15 5' 4.5" (1.638 m)  02/24/14 5' 4.5" (1.638 m)    General appearance: alert, cooperative and appears stated age Head: Normocephalic, without obvious abnormality, atraumatic Neck: no adenopathy, supple, symmetrical, trachea midline and thyroid normal to inspection and palpation Lungs: clear to auscultation bilaterally Breasts: normal appearance, no masses or tenderness Heart: regular rate and rhythm Abdomen: soft, non-tender; no masses,  no organomegaly Extremities: extremities normal, atraumatic, no  cyanosis or edema Skin: Skin color, texture, turgor normal. No rashes or lesions Lymph nodes: Cervical, supraclavicular, and axillary nodes normal. No abnormal inguinal nodes palpated Neurologic: Grossly normal   Pelvic: External genitalia:  no lesions              Urethra:  atrophic appearing urethra with no masses, tenderness or lesions.  No prolapse              Bartholin's and Skene's: normal                 Vagina: atrophic appearing vagina with normal color and discharge, no lesions              Cervix: anteverted              Pap taken: No. Bimanual Exam:  Uterus:  normal size, contour, position, consistency, mobility, non-tender              Adnexa: no mass, fullness, tenderness               Rectovaginal: Confirms               Anus:  normal sphincter tone, no lesions  Chaperone present: yes  A:  Well Woman with normal exam  Postmenopausal              Atrophic vaginitis and urethritis - currently off  vaginal estrogen              Insomnia               Psoriatic arthritis   Fungal infection of left eye - treated at [facility]  Known history of hematuria per pt (no Uro eval)  P:   Reviewed health and wellness pertinent to exam  Pap smear as above  Mammogram is due and will schedule  Colonoscopy is due and is scheduled for 10/17  Did not refill vaginal estrogen cream at this time - she may need later and will call.  She needs Mammo update before a refill.  Follow with labs - she needs labs done for dermatologist as well.- will CB with that information. Baird Lyons is aware.  Follow with urine micro and culture  Counseled on breast self exam, mammography screening, adequate intake of calcium and vitamin D, diet and exercise, Kegel's exercises return annually or prn  An After Visit Summary was printed and given to the patient.

## 2016-04-11 NOTE — Patient Instructions (Signed)

## 2016-04-12 LAB — LIPID PANEL
Cholesterol: 199 mg/dL (ref 125–200)
HDL: 73 mg/dL (ref >=46)
LDL Cholesterol: 104 mg/dL (ref <130)
Total CHOL/HDL Ratio: 2.7 Ratio (ref <=5.0)
Triglycerides: 111 mg/dL (ref <150)
VLDL: 22 mg/dL (ref <30)

## 2016-04-12 LAB — COMPREHENSIVE METABOLIC PANEL
ALT: 17 U/L (ref 6–29)
AST: 19 U/L (ref 10–35)
Albumin: 4.3 g/dL (ref 3.6–5.1)
Alkaline Phosphatase: 58 U/L (ref 33–130)
BUN: 19 mg/dL (ref 7–25)
CO2: 22 mmol/L (ref 20–31)
Calcium: 9.1 mg/dL (ref 8.6–10.4)
Chloride: 101 mmol/L (ref 98–110)
Creat: 0.89 mg/dL (ref 0.50–0.99)
Glucose, Bld: 97 mg/dL (ref 65–99)
Potassium: 4.6 mmol/L (ref 3.5–5.3)
Sodium: 140 mmol/L (ref 135–146)
Total Bilirubin: 0.3 mg/dL (ref 0.2–1.2)
Total Protein: 6.8 g/dL (ref 6.1–8.1)

## 2016-04-12 LAB — URINALYSIS, MICROSCOPIC ONLY
Bacteria, UA: NONE SEEN [HPF]
Casts: NONE SEEN [LPF]
Crystals: NONE SEEN [HPF]
Squamous Epithelial / LPF: NONE SEEN [HPF] (ref <=5)
WBC, UA: NONE SEEN WBC/HPF (ref <=5)
Yeast: NONE SEEN [HPF]

## 2016-04-12 LAB — VITAMIN D 25 HYDROXY (VIT D DEFICIENCY, FRACTURES): Vit D, 25-Hydroxy: 29 ng/mL — ABNORMAL LOW (ref 30–100)

## 2016-04-12 LAB — HEMOGLOBIN A1C
Hgb A1c MFr Bld: 5.3 % (ref <5.7)
Mean Plasma Glucose: 105 mg/dL

## 2016-04-12 LAB — TSH: TSH: 4.08 mIU/L

## 2016-04-12 LAB — HIV ANTIBODY (ROUTINE TESTING W REFLEX): HIV 1&2 Ab, 4th Generation: NONREACTIVE

## 2016-04-12 LAB — HEPATITIS C ANTIBODY: HCV Ab: NEGATIVE

## 2016-04-12 NOTE — Progress Notes (Signed)
Reviewed personally.  M. Suzanne Arther Heisler, MD.  

## 2016-04-13 LAB — URINE CULTURE: Organism ID, Bacteria: NO GROWTH

## 2016-04-21 ENCOUNTER — Ambulatory Visit
Admission: RE | Admit: 2016-04-21 | Discharge: 2016-04-21 | Disposition: A | Discharge status: Home / Self Care | Payer: BLUE CROSS/BLUE SHIELD | Source: Ambulatory Visit | Attending: Nurse Practitioner | Admitting: Nurse Practitioner

## 2016-04-21 DIAGNOSIS — Z1231 Encounter for screening mammogram for malignant neoplasm of breast: Secondary | ICD-10-CM

## 2016-04-21 DIAGNOSIS — E2839 Other primary ovarian failure: Secondary | ICD-10-CM

## 2016-05-18 ENCOUNTER — Other Ambulatory Visit: Payer: Self-pay | Admitting: Nurse Practitioner

## 2016-05-18 NOTE — Telephone Encounter (Signed)
Medication refill request: Zolpidem 10mg  Last AEX:  04/11/16 PG Next AEX: 04/23/17  Last MMG (if hormonal medication request): 04/21/16 BIRADS1 negative Refill authorized: 10/12/14 #30 w/5 refills; today please advise

## 2016-05-19 NOTE — Telephone Encounter (Signed)
RX faxed to [facility].

## 2016-06-14 HISTORY — PX: CATARACT EXTRACTION: SUR2

## 2016-12-11 ENCOUNTER — Other Ambulatory Visit: Payer: Self-pay | Admitting: Nurse Practitioner

## 2016-12-11 NOTE — Telephone Encounter (Signed)
Rx faxed to [facility]. 

## 2016-12-11 NOTE — Telephone Encounter (Signed)
Medication refill request: Ambien  Last AEX:  04/11/16 PG Next AEX: 04/23/17 PG Last MMG (if hormonal medication request): 04/21/16 BIRADS1:neg  Refill authorized: 05/19/16 #30/5R. Today please advise.

## 2017-04-23 ENCOUNTER — Ambulatory Visit: Payer: BLUE CROSS/BLUE SHIELD | Admitting: Nurse Practitioner

## 2017-05-04 ENCOUNTER — Ambulatory Visit: Payer: BLUE CROSS/BLUE SHIELD | Admitting: Obstetrics & Gynecology

## 2017-06-11 ENCOUNTER — Ambulatory Visit (INDEPENDENT_AMBULATORY_CARE_PROVIDER_SITE_OTHER): Payer: BLUE CROSS/BLUE SHIELD | Admitting: Obstetrics & Gynecology

## 2017-06-11 ENCOUNTER — Other Ambulatory Visit (HOSPITAL_COMMUNITY)
Admission: RE | Admit: 2017-06-11 | Discharge: 2017-06-11 | Disposition: A | Discharge status: Home / Self Care | Payer: BLUE CROSS/BLUE SHIELD | Source: Ambulatory Visit | Attending: Obstetrics & Gynecology | Admitting: Obstetrics & Gynecology

## 2017-06-11 ENCOUNTER — Encounter: Payer: Self-pay | Admitting: Obstetrics & Gynecology

## 2017-06-11 VITALS — BP 110/72 | HR 72 | Resp 16 | Ht 63.25 in | Wt 158.2 lb

## 2017-06-11 DIAGNOSIS — Z124 Encounter for screening for malignant neoplasm of cervix: Secondary | ICD-10-CM | POA: Insufficient documentation

## 2017-06-11 DIAGNOSIS — Z23 Encounter for immunization: Secondary | ICD-10-CM | POA: Diagnosis not present

## 2017-06-11 DIAGNOSIS — Z01419 Encounter for gynecological examination (general) (routine) without abnormal findings: Secondary | ICD-10-CM | POA: Diagnosis not present

## 2017-06-11 NOTE — Progress Notes (Signed)
66 y.o. G0P0000 Married Caucasian F here for annual exam.  Had fungal infection in eye after being in Enbrel and Cosentyx.  Had TB and then fungal eye infection.  Had to have corneal transplant and then cataract removal.  Vision is stable but not as good as it was.    Rheumatologist:  Dr. Dierdre ForthBeekman.  Off all biologics.  Arthritis is worse, of course.  Coping well.  So grateful the last year and a half are "behind me".  PCP:  Dr. Talmage NapBalan (endocrinologist).     Psychiatrist:  Dr. Evelene CroonKaur.  Denies vaginal bleeding.  Patient's last menstrual period was 12/06/2005.          Sexually active: No.  The current method of family planning is post menopausal status.    Exercising: Yes.    walking Smoker:  no  Health Maintenance: Pap: 04/07/15 negative, HR HPV negative, 02/05/12 negative, HR HPV negative  History of abnormal Pap:  yes MMG:  04/21/16 BIRADS 1 negative  Colonoscopy:  2018 with Dr. Loreta AveMann, normal per patient  BMD:   04/21/16 normal  TDaP:  12/13/06- would like to update today  Pneumonia vaccine(s):  [facility]  Zostavax and Shingrix:  Completed Hep C testing: 04/11/16 negative  Screening Labs: Endocrinologist, Hb today: same   reports that she has never smoked. She has never used smokeless tobacco. She reports that she drinks about 9.0 oz of alcohol per week . She reports that she does not use drugs.  Past Medical History:  Diagnosis Date  . Dyslipidemia   . FHx: Alzheimer's disease    Positive  . Insomnia    long Hx of who presents for evaluation of palpitations and chest discomfort  . Menorrhagia   . Palpitations   . Psoriasis   . Psoriasis   . 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  . Thyroid disease    hypothyroidism    Past Surgical History:  Procedure Laterality Date  . 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 TONSILLECTOMY  age 66  . WISDOM TOOTH EXTRACTION  age 66    Current Outpatient Prescriptions   Medication Sig Dispense Refill  . atorvastatin (LIPITOR) 10 MG tablet Take 10 mg by mouth daily.    . celecoxib (CELEBREX) 200 MG capsule take 1 capsule by mouth once daily with food  0  . levothyroxine (SYNTHROID, LEVOTHROID) 75 MCG tablet   1  . OTEZLA 30 MG TABS   4  . prednisoLONE acetate (PRED FORTE) 1 % ophthalmic suspension Place 1 drop into the left eye 2 (two) times daily.    . sertraline (ZOLOFT) 100 MG tablet Take 200 mg by mouth.     . zolpidem (AMBIEN) 10 MG tablet take 1/2 to 1 tablet by mouth at bedtime if needed 30 tablet 5   No current facility-administered medications for this visit.     Family History  Problem Relation Age of Onset  . Thyroid disease Mother   . Diabetes Father        addm  . Thyroid disease Father   . Dementia Father   . Thyroid disease Brother     ROS:  Pertinent items are noted in HPI.  Otherwise, a comprehensive ROS was negative.  Exam:   BP 110/72 (BP Location: Right Arm, Patient Position: Sitting, Cuff Size: Normal)   Pulse 72   Resp 16   Ht 5' 3.25" (1.607 m)  Wt 158 lb 4 oz (71.8 kg)   LMP 12/06/2005   BMI 27.81 kg/m    Height: 5' 3.25" (160.7 cm)  Ht Readings from Last 3 Encounters:  06/11/17 5' 3.25" (1.607 m)  04/11/16 5' 3.75" (1.619 m)  10/24/15 5\' 3"  (1.6 m)    General appearance: alert, cooperative and appears stated age Head: Normocephalic, without obvious abnormality, atraumatic Neck: no adenopathy, supple, symmetrical, trachea midline and thyroid normal to inspection and palpation Lungs: clear to auscultation bilaterally Breasts: normal appearance, no masses or tenderness Heart: regular rate and rhythm Abdomen: soft, non-tender; bowel sounds normal; no masses,  no organomegaly Extremities: extremities normal, atraumatic, no cyanosis or edema Skin: Skin color, texture, turgor normal. No rashes or lesions Lymph nodes: Cervical, supraclavicular, and axillary nodes normal. No abnormal inguinal nodes  palpated Neurologic: Grossly normal   Pelvic: External genitalia:  no lesions              Urethra:  normal appearing urethra with no masses, tenderness or lesions              Bartholins and Skenes: normal                 Vagina: normal appearing vagina with normal color and discharge, no lesions              Cervix: no lesions              Pap taken: Yes.   Bimanual Exam:  Uterus:  normal size, contour, position, consistency, mobility, non-tender              Adnexa: normal adnexa and no mass, fullness, tenderness               Rectovaginal: Confirms               Anus:  normal sphincter tone, no lesions  Chaperone was present for exam.  A:  Well Woman with normal exam PMP, no HRT H/O atrophic vaginitis Insomnia, uses ambien rarely Psoriatic arthritis H/o fungal infection and TB after biologic medication for psoriatic arthritis  P:   Mammogram guidelines reviewed pap smear obtained today Release of colonoscopy will be signed today No blood work obtained today No lab work obtained today Tdap given today Return annually or prn

## 2017-06-13 LAB — CYTOLOGY - PAP: Diagnosis: NEGATIVE

## 2017-09-02 ENCOUNTER — Other Ambulatory Visit: Payer: Self-pay | Admitting: Neurosurgery

## 2017-09-02 ENCOUNTER — Ambulatory Visit
Admission: RE | Admit: 2017-09-02 | Discharge: 2017-09-02 | Disposition: A | Discharge status: Home / Self Care | Payer: BLUE CROSS/BLUE SHIELD | Source: Ambulatory Visit | Attending: Neurosurgery | Admitting: Neurosurgery

## 2017-09-02 DIAGNOSIS — M5412 Radiculopathy, cervical region: Secondary | ICD-10-CM

## 2017-10-23 ENCOUNTER — Ambulatory Visit: Payer: BLUE CROSS/BLUE SHIELD | Admitting: Obstetrics & Gynecology

## 2018-04-20 ENCOUNTER — Other Ambulatory Visit: Payer: Self-pay | Admitting: Obstetrics & Gynecology

## 2018-04-22 NOTE — Telephone Encounter (Signed)
Medication refill request: Zolpidem 10mg   Last AEX:  06/11/17 Next AEX: 08/27/18 Last MMG (if hormonal medication request):  04/21/16 Bi-rads category 1 neg  Refill authorized: Please advise

## 2018-07-01 ENCOUNTER — Other Ambulatory Visit: Payer: Self-pay | Admitting: Obstetrics & Gynecology

## 2018-07-01 NOTE — Telephone Encounter (Signed)
Medication refill request: Ambien  Last AEX:  06/11/17 Next AEX: 08/27/18 Last MMG (if hormonal medication request):04/21/16   Bi-rads 1 neg  Refill authorized: #30 with 0 RF Please refill if appropriate .

## 2018-08-27 ENCOUNTER — Other Ambulatory Visit: Payer: Self-pay

## 2018-08-27 ENCOUNTER — Encounter: Payer: Self-pay | Admitting: Obstetrics & Gynecology

## 2018-08-27 ENCOUNTER — Ambulatory Visit (INDEPENDENT_AMBULATORY_CARE_PROVIDER_SITE_OTHER): Payer: BLUE CROSS/BLUE SHIELD | Admitting: Obstetrics & Gynecology

## 2018-08-27 ENCOUNTER — Other Ambulatory Visit (HOSPITAL_COMMUNITY)
Admission: RE | Admit: 2018-08-27 | Discharge: 2018-08-27 | Disposition: A | Discharge status: Home / Self Care | Payer: BLUE CROSS/BLUE SHIELD | Source: Ambulatory Visit | Attending: Obstetrics & Gynecology | Admitting: Obstetrics & Gynecology

## 2018-08-27 VITALS — BP 120/68 | HR 72 | Resp 16 | Ht 63.25 in | Wt 151.6 lb

## 2018-08-27 DIAGNOSIS — Z124 Encounter for screening for malignant neoplasm of cervix: Secondary | ICD-10-CM | POA: Insufficient documentation

## 2018-08-27 DIAGNOSIS — Z01419 Encounter for gynecological examination (general) (routine) without abnormal findings: Secondary | ICD-10-CM

## 2018-08-27 MED ORDER — ZOLPIDEM TARTRATE 10 MG PO TABS: 10.0000 mg | ORAL_TABLET | Freq: Every evening | ORAL | 2 refills | Status: DC | PRN

## 2018-08-27 NOTE — Patient Instructions (Signed)
Wendy Spikes, DO [facility] [address] [place], [place] [address]  Main: [phone]

## 2018-08-27 NOTE — Progress Notes (Signed)
68 y.o. G0P0000 Married White or Caucasian female here for annual exam.  Denies vaginal bleeding.  Doing well.  Takes about 1/3 Ambien about every night.    Does not have a PCP.  Sees Dr. Talmage NapBalan, endocrinologist; Dr. Dierdre ForthBeekman, rheumatologist, and [facility] at [facility].  Patient's last menstrual period was 12/06/2005.          Sexually active: Yes.    The current method of family planning is post menopausal status.    Exercising: Yes.    walking, weights Smoker:  no  Health Maintenance: Pap:  06/11/17 Neg   04/07/15 Neg. HR HPV:neg  History of abnormal Pap:  yes MMG:  04/21/16 BIRADS1:neg.  Aware.   Colonoscopy:  05/26/16 Normal. F/u 10 years  BMD:   04/21/16 Normal  TDaP:  2018 Pneumonia vaccine(s):  Done  Shingrix:   Completed  Hep C testing: 04/11/16 Neg  Screening Labs: Dr. Talmage NapBalan    reports that she has never smoked. She has never used smokeless tobacco. She reports current alcohol use of about 10.0 standard drinks of alcohol per week. She reports that she does not use drugs.  Past Medical History:  Diagnosis Date  . Dyslipidemia   . FHx: Alzheimer's disease    Positive  . Insomnia    long Hx of who presents for evaluation of palpitations and chest discomfort  . Menorrhagia   . Palpitations   . Psoriasis   . Psoriasis   . 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  . Thyroid disease    hypothyroidism    Past Surgical History:  Procedure Laterality Date  . CATARACT EXTRACTION Left 06/2016  . 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 TONSILLECTOMY  age 68  . WISDOM TOOTH EXTRACTION  age 68    Current Outpatient Medications  Medication Sig Dispense Refill  . atorvastatin (LIPITOR) 10 MG tablet Take 10 mg by mouth daily.    . celecoxib (CELEBREX) 200 MG capsule take 1 capsule by mouth once daily with food  0  . levothyroxine (SYNTHROID, LEVOTHROID) 75 MCG tablet   1  . prednisoLONE acetate (PRED FORTE) 1 %  ophthalmic suspension Place 1 drop into the left eye 2 (two) times daily.    . sertraline (ZOLOFT) 100 MG tablet Take 200 mg by mouth.     . ustekinumab (STELARA) 45 MG/0.5ML SOSY injection every 3 (three) months.    . zolpidem (AMBIEN) 10 MG tablet TAKE 1 TABLET BY MOUTH AT BEDTIME AS NEEDED 30 tablet 0   No current facility-administered medications for this visit.     Family History  Problem Relation Age of Onset  . Thyroid disease Mother   . Diabetes Father        addm  . Thyroid disease Father   . Dementia Father   . Thyroid disease Brother     Review of Systems  HENT: Positive for hearing loss.   All other systems reviewed and are negative.   Exam:   BP 120/68 (BP Location: Right Arm, Patient Position: Sitting, Cuff Size: Large)   Pulse 72   Resp 16   Ht 5' 3.25" (1.607 m)   Wt 151 lb 9.6 oz (68.8 kg)   LMP 12/06/2005   BMI 26.64 kg/m   Height: 5' 3.25" (160.7 cm)  Ht Readings from Last 3 Encounters:  08/27/18 5' 3.25" (1.607 m)  06/11/17 5' 3.25" (1.607 m)  04/11/16 5' 3.75" (1.619  m)    General appearance: alert, cooperative and appears stated age Head: Normocephalic, without obvious abnormality, atraumatic Neck: no adenopathy, supple, symmetrical, trachea midline and thyroid normal to inspection and palpation Lungs: clear to auscultation bilaterally Breasts: normal appearance, no masses or tenderness Heart: regular rate and rhythm Abdomen: soft, non-tender; bowel sounds normal; no masses,  no organomegaly Extremities: extremities normal, atraumatic, no cyanosis or edema Skin: Skin color, texture, turgor normal. No rashes or lesions Lymph nodes: Cervical, supraclavicular, and axillary nodes normal. No abnormal inguinal nodes palpated Neurologic: Grossly normal   Pelvic: External genitalia:  no lesions              Urethra:  normal appearing urethra with no masses, tenderness or lesions              Bartholins and Skenes: normal                 Vagina:  normal appearing vagina with normal color and discharge, no lesions              Cervix: no lesions              Pap taken: No. Bimanual Exam:  Uterus:  normal size, contour, position, consistency, mobility, non-tender              Adnexa: normal adnexa and no mass, fullness, tenderness               Rectovaginal: Confirms               Anus:  normal sphincter tone, no lesions  Chaperone was present for exam.  A:  Well Woman with normal exam PMP, no HRT H/o atrophic vaginitis Insomnia, using Ambien regularly Psoriatic arthritis HO fungal infection and TB after treatment for psoriatic arthritis  P:   Mammogram guidelines reviewed.  Pt aware she is overdue.  She will schedule this. pap smear neg 10/18.  Obtained today. Rx for Prevnar given to pt today to do this yea.r   No lab work obtained Rx for Ambien 10mg  1/3 tab qhs prn #30/2RF Return annually or prn

## 2018-08-29 ENCOUNTER — Other Ambulatory Visit: Payer: Self-pay | Admitting: Obstetrics & Gynecology

## 2018-08-29 DIAGNOSIS — Z1231 Encounter for screening mammogram for malignant neoplasm of breast: Secondary | ICD-10-CM

## 2018-08-29 LAB — CYTOLOGY - PAP: Diagnosis: NEGATIVE

## 2018-10-01 ENCOUNTER — Ambulatory Visit
Admission: RE | Admit: 2018-10-01 | Discharge: 2018-10-01 | Disposition: A | Discharge status: Home / Self Care | Payer: BLUE CROSS/BLUE SHIELD | Source: Ambulatory Visit | Attending: Obstetrics & Gynecology | Admitting: Obstetrics & Gynecology

## 2018-10-01 DIAGNOSIS — Z1231 Encounter for screening mammogram for malignant neoplasm of breast: Secondary | ICD-10-CM

## 2018-10-03 ENCOUNTER — Other Ambulatory Visit: Payer: Self-pay | Admitting: Obstetrics & Gynecology

## 2018-10-03 DIAGNOSIS — R928 Other abnormal and inconclusive findings on diagnostic imaging of breast: Secondary | ICD-10-CM

## 2018-10-09 ENCOUNTER — Ambulatory Visit
Admission: RE | Admit: 2018-10-09 | Discharge: 2018-10-09 | Disposition: A | Discharge status: Home / Self Care | Payer: BLUE CROSS/BLUE SHIELD | Source: Ambulatory Visit | Attending: Obstetrics & Gynecology | Admitting: Obstetrics & Gynecology

## 2018-10-09 ENCOUNTER — Ambulatory Visit: Admission: RE | Admit: 2018-10-09 | Payer: BLUE CROSS/BLUE SHIELD | Source: Ambulatory Visit

## 2018-10-09 DIAGNOSIS — R928 Other abnormal and inconclusive findings on diagnostic imaging of breast: Secondary | ICD-10-CM

## 2019-02-07 ENCOUNTER — Other Ambulatory Visit: Payer: Self-pay | Admitting: Gastroenterology

## 2019-02-07 DIAGNOSIS — R1011 Right upper quadrant pain: Secondary | ICD-10-CM

## 2019-02-25 ENCOUNTER — Other Ambulatory Visit: Payer: Self-pay

## 2019-02-25 ENCOUNTER — Encounter (HOSPITAL_COMMUNITY)
Admission: RE | Admit: 2019-02-25 | Discharge: 2019-02-25 | Disposition: A | Discharge status: Home / Self Care | Payer: BC Managed Care – PPO | Source: Ambulatory Visit | Attending: Gastroenterology | Admitting: Gastroenterology

## 2019-02-25 DIAGNOSIS — R1011 Right upper quadrant pain: Secondary | ICD-10-CM | POA: Insufficient documentation

## 2019-02-25 MED ORDER — TECHNETIUM TC 99M MEBROFENIN IV KIT
5.4000 | PACK | Freq: Once | INTRAVENOUS | Status: AC | PRN
  Administered 2019-02-25: 5.4 via INTRAVENOUS

## 2019-03-19 ENCOUNTER — Other Ambulatory Visit: Payer: Self-pay | Admitting: Obstetrics & Gynecology

## 2019-03-19 NOTE — Telephone Encounter (Signed)
Medication refill request: ambien 10mg  Last AEX:  08-27-2018 Next AEX: 12-30-2019 Last MMG (if hormonal medication request): n/a Refill authorized: please approve if appropriate

## 2019-05-16 ENCOUNTER — Other Ambulatory Visit: Payer: Self-pay

## 2019-05-16 DIAGNOSIS — Z20822 Contact with and (suspected) exposure to covid-19: Secondary | ICD-10-CM

## 2019-05-17 LAB — NOVEL CORONAVIRUS, NAA: SARS-CoV-2, NAA: NOT DETECTED

## 2019-05-29 ENCOUNTER — Other Ambulatory Visit: Payer: Self-pay

## 2019-05-29 DIAGNOSIS — Z20822 Contact with and (suspected) exposure to covid-19: Secondary | ICD-10-CM

## 2019-05-30 LAB — NOVEL CORONAVIRUS, NAA: SARS-CoV-2, NAA: DETECTED — AB

## 2019-05-31 ENCOUNTER — Telehealth: Payer: Self-pay

## 2019-05-31 NOTE — Telephone Encounter (Signed)
Pt given Covid-19 positive results. Discussed mild, moderate and severe symptoms. Advised pt to call 911 for any respiratory issues and/dehydration. Discussed non test criteria for ending self isolation. Pt advised of way to manage symptoms at home and review isolation precautions especially the importance of washing hands frequently and wearing a mask when around others. Pt verbalized understanding. Pt results to be reported to [facility].   Previous note stated Dr Nancy Fetter notified-pt is not a pt of Dr Nancy Fetter

## 2019-06-15 ENCOUNTER — Other Ambulatory Visit: Payer: Self-pay | Admitting: Obstetrics & Gynecology

## 2019-06-16 NOTE — Telephone Encounter (Signed)
Medication refill request: ambien 10mg  Last AEX:  08-27-2018 Next AEX: not scheduled Last MMG (if hormonal medication request): n/a Refill authorized: per epic. Patient had + covid 19 results on 05-29-2019. Please approve or deny rx as appropriate.

## 2019-06-16 NOTE — Telephone Encounter (Signed)
Please call pt and see how she is doing from Covid standpoint.  I don't want to refill this until I know she is fully recovered.  Thanks.

## 2019-06-16 NOTE — Telephone Encounter (Signed)
Patient states she has been cleared by the health department. She states she feels good with no fever or cough. Occasional tiredness but she is happy to be back to her normal. Please approve rx if appropriate.

## 2019-07-31 ENCOUNTER — Ambulatory Visit: Payer: BC Managed Care – PPO | Admitting: Neurology

## 2019-09-03 ENCOUNTER — Other Ambulatory Visit: Payer: Self-pay

## 2019-09-03 ENCOUNTER — Ambulatory Visit: Payer: BC Managed Care – PPO | Attending: Internal Medicine

## 2019-09-03 DIAGNOSIS — Z23 Encounter for immunization: Secondary | ICD-10-CM

## 2019-09-03 NOTE — Progress Notes (Signed)
   Covid-19 Vaccination Clinic  Name:  Wendy Fletcher    MRN: [id number] DOB: 1951-08-14  09/03/2019  Ms. Wendy Fletcher was observed post Covid-19 immunization for 15 minutes without incidence. She was provided with Vaccine Information Sheet and instruction to access the V-Safe system.   Ms. Wendy Fletcher was instructed to call 911 with any severe reactions post vaccine: Marland Kitchen Difficulty breathing  . Swelling of your face and throat  . A fast heartbeat  . A bad rash all over your body  . Dizziness and weakness    Immunizations Administered    Name Date Dose VIS Date Route   Pfizer COVID-19 Vaccine 09/03/2019  4:26 PM 0.3 mL 07/25/2019 Intramuscular   Manufacturer: [employer], [employer]   Lot: [id number]   NDC: [address]-6

## 2019-09-04 ENCOUNTER — Other Ambulatory Visit: Payer: Self-pay

## 2019-09-04 ENCOUNTER — Other Ambulatory Visit: Payer: Self-pay | Admitting: Obstetrics & Gynecology

## 2019-09-04 NOTE — Telephone Encounter (Signed)
Medication refill request: Ambien 5mg  #30, R0 Last AEX:  08-27-18 Next AEX: 12-30-19 Last MMG (if hormonal medication request): n/a Refill authorized: please refill if appropriate

## 2019-09-10 ENCOUNTER — Ambulatory Visit: Payer: Medicare Other | Admitting: Neurology

## 2019-09-13 ENCOUNTER — Ambulatory Visit: Payer: Self-pay

## 2019-09-15 ENCOUNTER — Other Ambulatory Visit: Payer: Self-pay | Admitting: Obstetrics & Gynecology

## 2019-09-15 DIAGNOSIS — Z1231 Encounter for screening mammogram for malignant neoplasm of breast: Secondary | ICD-10-CM

## 2019-09-24 ENCOUNTER — Ambulatory Visit: Payer: Self-pay

## 2019-09-24 ENCOUNTER — Ambulatory Visit: Payer: BC Managed Care – PPO | Attending: Internal Medicine

## 2019-09-24 DIAGNOSIS — Z23 Encounter for immunization: Secondary | ICD-10-CM

## 2019-09-24 NOTE — Progress Notes (Signed)
   Covid-19 Vaccination Clinic  Name:  Wendy Fletcher    MRN: [id number] DOB: 1951/02/15  09/24/2019  Wendy Fletcher was observed post Covid-19 immunization for 15 minutes without incidence. She was provided with Vaccine Information Sheet and instruction to access the V-Safe system.   Wendy Fletcher was instructed to call 911 with any severe reactions post vaccine: Marland Kitchen Difficulty breathing  . Swelling of your face and throat  . A fast heartbeat  . A bad rash all over your body  . Dizziness and weakness    Immunizations Administered    Name Date Dose VIS Date Route   Pfizer COVID-19 Vaccine 09/24/2019 11:51 AM 0.3 mL 07/25/2019 Intramuscular   Manufacturer: [employer], [employer]   Lot: [id number]   NDC: [address]-0

## 2019-10-08 ENCOUNTER — Other Ambulatory Visit: Payer: Self-pay | Admitting: Obstetrics & Gynecology

## 2019-10-08 NOTE — Telephone Encounter (Signed)
Medication refill request: Wendy Fletcher  Last AEX:  08-27-2018 SM  Next AEX: 12-30-19 Last MMG (if hormonal medication request): n/a Refill authorized: Today, please advise.   Medication pended for #30, 0RF. Please refill if appropriate.

## 2019-10-10 ENCOUNTER — Encounter: Payer: Self-pay | Admitting: Obstetrics & Gynecology

## 2019-10-10 ENCOUNTER — Other Ambulatory Visit: Payer: Self-pay | Admitting: Obstetrics & Gynecology

## 2019-10-10 NOTE — Telephone Encounter (Signed)
Patient called to check status of refill request.

## 2019-10-10 NOTE — Telephone Encounter (Signed)
Routed to Dr. Miller

## 2019-10-10 NOTE — Telephone Encounter (Signed)
Patient sent the following correspondence through MyChart.  Hi Dr Hyacinth Meeker, On Wed, I sent  a prescription refill for zolpidem 5 mg request to my Pharmacy -[facility] on [address]. I am out!!!They faxed you the request on Wed. They are closed on the weekend. I called your office this morning and Greta said she would send an email to the nurses station. That was at 9:00.  I called Greta back at 1 and she could only help me by sending another email. I called the pharmacy and they are faxing it again. Can someone in your office please help me before Monday????  Thanks!  Wendy Fletcher

## 2019-10-10 NOTE — Telephone Encounter (Signed)
Rx has already been routed to Dr Hyacinth Meeker.

## 2019-10-10 NOTE — Telephone Encounter (Signed)
Patient called again stating that her pharmacy in closed over the weekend and really needs her ambien refill today if possible. [facility] [phone].

## 2019-10-22 ENCOUNTER — Other Ambulatory Visit: Payer: Self-pay

## 2019-10-22 ENCOUNTER — Ambulatory Visit
Admission: RE | Admit: 2019-10-22 | Discharge: 2019-10-22 | Disposition: A | Discharge status: Home / Self Care | Payer: BC Managed Care – PPO | Source: Ambulatory Visit | Attending: Obstetrics & Gynecology | Admitting: Obstetrics & Gynecology

## 2019-10-22 DIAGNOSIS — Z1231 Encounter for screening mammogram for malignant neoplasm of breast: Secondary | ICD-10-CM

## 2019-12-29 ENCOUNTER — Other Ambulatory Visit: Payer: Self-pay

## 2019-12-30 ENCOUNTER — Ambulatory Visit (INDEPENDENT_AMBULATORY_CARE_PROVIDER_SITE_OTHER): Payer: BC Managed Care – PPO | Admitting: Obstetrics & Gynecology

## 2019-12-30 ENCOUNTER — Encounter: Payer: Self-pay | Admitting: Obstetrics & Gynecology

## 2019-12-30 VITALS — BP 118/72 | HR 76 | Temp 97.7°F | Ht 63.0 in | Wt 149.0 lb

## 2019-12-30 DIAGNOSIS — Z01419 Encounter for gynecological examination (general) (routine) without abnormal findings: Secondary | ICD-10-CM | POA: Diagnosis not present

## 2019-12-30 NOTE — Progress Notes (Signed)
69 y.o. G0P0000 Married White or Caucasian female here for annual exam.  Denies vaginal bleeding.    Rheumatologist:  Dr. Dierdre Forth Endocrinologist:  Dr. Talmage Nap Ophthalmology:  [facility]  Patient's last menstrual period was 12/06/2005.          Sexually active: No.  The current method of family planning is post menopausal status.    Exercising: Yes.    walking Smoker:  no  Health Maintenance: Pap:  08/27/18 Negative  06/11/17 Neg  04/07/15 Neg:Neg HR HPV History of abnormal Pap:  no MMG:  10/22/19 BIRADS 1 negative/density b Colonoscopy:  05/26/16 Normal. F/u 10 years  BMD:   04/21/16 Normal TDaP:  2018 Pneumonia vaccine(s):  completed Shingrix:   completed Hep C testing: 04/11/16 Neg Screening Labs: PCP   reports that she has never smoked. She has never used smokeless tobacco. She reports current alcohol use of about 10.0 standard drinks of alcohol per week. She reports that she does not use drugs.  Past Medical History:  Diagnosis Date  . Dyslipidemia   . FHx: Alzheimer's disease    Positive  . Insomnia    long Hx of who presents for evaluation of palpitations and chest discomfort  . Menorrhagia   . Palpitations   . Psoriasis   . Psoriasis   . 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  . Thyroid disease    hypothyroidism    Past Surgical History:  Procedure Laterality Date  . CATARACT EXTRACTION Left 06/2016  . 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 TONSILLECTOMY  age 74  . WISDOM TOOTH EXTRACTION  age 27    Current Outpatient Medications  Medication Sig Dispense Refill  . Ascorbic Acid (VITAMIN C) 100 MG tablet Take 100 mg by mouth daily.    Marland Kitchen atorvastatin (LIPITOR) 10 MG tablet Take 10 mg by mouth daily.    . celecoxib (CELEBREX) 200 MG capsule Take 200 mg by mouth 2 (two) times daily.   0  . clobetasol (TEMOVATE) 0.05 % external solution APP EXT AA BID FOR 14 DAYS PRN    . desonide (DESOWEN) 0.05 % cream  APP EXT AA BID FOR 14 DAYS    . levothyroxine (SYNTHROID, LEVOTHROID) 75 MCG tablet   1  . ofloxacin (OCUFLOX) 0.3 % ophthalmic solution INSTILL 1 DROP IN LEFT EYE THREE TIMES DAILY FOR 3 DAYS    . Olopatadine HCl 0.2 % SOLN INSTILL 1 DROP IN BOTH EYES EVERY DAY    . prednisoLONE acetate (PRED FORTE) 1 % ophthalmic suspension Place 1 drop into the left eye 2 (two) times daily.    Marland Kitchen pyridoxine (B-6) 100 MG tablet Take by mouth.    . sertraline (ZOLOFT) 100 MG tablet Take 200 mg by mouth.     . ustekinumab (STELARA) 45 MG/0.5ML SOSY injection every 3 (three) months.    . zolpidem (AMBIEN) 5 MG tablet TAKE 1 TABLET(5 MG) BY MOUTH AT BEDTIME AS NEEDED 30 tablet 1   No current facility-administered medications for this visit.    Family History  Problem Relation Age of Onset  . Thyroid disease Mother   . Diabetes Father        addm  . Thyroid disease Father   . Dementia Father   . Thyroid disease Brother     Review of Systems  All other systems reviewed and are negative.   Exam:   BP 118/72 (BP Location: Right Arm,  Patient Position: Sitting, Cuff Size: Normal)   Pulse 76   Temp 97.7 F (36.5 C) (Temporal)   Ht 5\' 3"  (1.6 m)   Wt 149 lb (67.6 kg)   LMP 12/06/2005   BMI 26.39 kg/m   Height: 5\' 3"  (160 cm)  General appearance: alert, cooperative and appears stated age Head: Normocephalic, without obvious abnormality, atraumatic Neck: no adenopathy, supple, symmetrical, trachea midline and thyroid normal to inspection and palpation Lungs: clear to auscultation bilaterally Breasts: normal appearance, no masses or tenderness Heart: regular rate and rhythm Abdomen: soft, non-tender; bowel sounds normal; no masses,  no organomegaly Extremities: extremities normal, atraumatic, no cyanosis or edema Skin: Skin color, texture, turgor normal. No rashes or lesions Lymph nodes: Cervical, supraclavicular, and axillary nodes normal. No abnormal inguinal nodes palpated Neurologic: Grossly  normal   Pelvic: External genitalia:  no lesions              Urethra:  normal appearing urethra with no masses, tenderness or lesions              Bartholins and Skenes: normal                 Vagina: normal appearing vagina with normal color and discharge, no lesions              Cervix: no lesions              Pap taken: No. Bimanual Exam:  Uterus:  normal size, contour, position, consistency, mobility, non-tender              Adnexa: normal adnexa and no mass, fullness, tenderness               Rectovaginal: Confirms               Anus:  normal sphincter tone, no lesions  Chaperone, Terence Lux, CMA, was present for exam.  A:  Well Woman with normal exam PMP, no HRT Hypothyroidism Elevated lipids Psoriatic arthritis  P:   Mammogram guidelines reviewed pap smear neg 2020, not indicated today Colonoscopy UTD Plan BMD next year Order for pneumovax given to pt Does not need a RF for Ambien.  Pt knows to call when needs RF. No lab work done today. return annually or prn

## 2020-01-14 ENCOUNTER — Other Ambulatory Visit: Payer: Self-pay | Admitting: Obstetrics & Gynecology

## 2020-01-14 ENCOUNTER — Encounter: Payer: Self-pay | Admitting: Obstetrics & Gynecology

## 2020-01-14 MED ORDER — ZOLPIDEM TARTRATE 5 MG PO TABS: ORAL_TABLET | ORAL | 2 refills | Status: DC

## 2020-01-27 ENCOUNTER — Encounter: Payer: Self-pay | Admitting: Obstetrics & Gynecology

## 2020-04-16 ENCOUNTER — Other Ambulatory Visit: Payer: Self-pay

## 2020-04-16 NOTE — Telephone Encounter (Signed)
Medication refill request: Zolpidem Last AEX:  12/30/19 SM Next AEX: 03/03/21 Last MMG (if hormonal medication request): n/a Refill authorized: today, please advise

## 2020-04-16 NOTE — Telephone Encounter (Signed)
Could you please call pt and see if she is taking this every night?  I did RX for #30/2 RF 02/02/2020 so she really shouldn't need RFs yet.  Thanks.

## 2020-04-20 NOTE — Telephone Encounter (Signed)
Spoke with patient and she states that she is taking Zolpidem every night and needs a 90-day refill sent to [facility] pharmacy. Order changed to 5mg  by mouth at bedtime as reported by patient. Routing to Dr. for final review.

## 2020-04-26 ENCOUNTER — Other Ambulatory Visit: Payer: Self-pay | Admitting: Obstetrics & Gynecology

## 2020-04-26 MED ORDER — ZOLPIDEM TARTRATE 5 MG PO TABS: 5.0000 mg | ORAL_TABLET | Freq: Every day | ORAL | 5 refills | Status: DC

## 2020-04-26 MED ORDER — ZOLPIDEM TARTRATE 5 MG PO TABS: 5.0000 mg | ORAL_TABLET | Freq: Every day | ORAL | 0 refills | Status: DC

## 2020-04-26 NOTE — Telephone Encounter (Signed)
Rx done.  Called pt.  I only do 30 day supplies for this.  Pt and I have discussed this in the past.  She has new insurance and that is where the 90 day supply request came from.  She is fine with 30 day supply.  Rx completed.  Also rx for #14/0RF to local pharmacy on file completed as well so she can have until mail order comes.  Ok to close encounter.

## 2020-04-26 NOTE — Telephone Encounter (Signed)
Patient is calling regarding Zolpidem refill. Patient stated that she is completely out now. She said she has slept about 10 hours in the last 5 days due to being out of medicine.

## 2020-05-10 ENCOUNTER — Ambulatory Visit: Payer: Medicare Other | Attending: Internal Medicine

## 2020-05-10 ENCOUNTER — Other Ambulatory Visit (HOSPITAL_BASED_OUTPATIENT_CLINIC_OR_DEPARTMENT_OTHER): Payer: Self-pay | Admitting: Internal Medicine

## 2020-05-10 DIAGNOSIS — Z23 Encounter for immunization: Secondary | ICD-10-CM

## 2020-05-17 ENCOUNTER — Other Ambulatory Visit (HOSPITAL_COMMUNITY): Payer: Self-pay | Admitting: *Deleted

## 2020-05-18 ENCOUNTER — Ambulatory Visit (HOSPITAL_COMMUNITY)
Admission: RE | Admit: 2020-05-18 | Discharge: 2020-05-18 | Disposition: A | Discharge status: Home / Self Care | Payer: Medicare Other | Source: Ambulatory Visit | Attending: Physician Assistant | Admitting: Physician Assistant

## 2020-05-18 ENCOUNTER — Other Ambulatory Visit: Payer: Self-pay

## 2020-05-18 DIAGNOSIS — L4050 Arthropathic psoriasis, unspecified: Secondary | ICD-10-CM | POA: Insufficient documentation

## 2020-05-18 DIAGNOSIS — L409 Psoriasis, unspecified: Secondary | ICD-10-CM | POA: Insufficient documentation

## 2020-05-18 MED ORDER — USTEKINUMAB 45 MG/0.5ML ~~LOC~~ SOSY
45.0000 mg | PREFILLED_SYRINGE | Freq: Once | SUBCUTANEOUS | Status: AC
  Administered 2020-05-18: 45 mg via SUBCUTANEOUS
  Filled 2020-05-18: qty 0.5

## 2020-05-18 MED FILL — PFIZER-BIONTECH COVID-19 VA: 30 | 1 days supply | Qty: 0 | Fill #0

## 2020-06-15 ENCOUNTER — Encounter (HOSPITAL_COMMUNITY): Payer: Self-pay

## 2020-08-09 ENCOUNTER — Other Ambulatory Visit (HOSPITAL_COMMUNITY): Payer: Self-pay | Admitting: *Deleted

## 2020-08-10 ENCOUNTER — Inpatient Hospital Stay (HOSPITAL_COMMUNITY): Admission: RE | Admit: 2020-08-10 | Payer: Self-pay | Source: Ambulatory Visit

## 2020-08-18 ENCOUNTER — Other Ambulatory Visit (HOSPITAL_COMMUNITY): Payer: Self-pay | Admitting: *Deleted

## 2020-08-19 ENCOUNTER — Other Ambulatory Visit: Payer: Self-pay

## 2020-08-19 ENCOUNTER — Encounter (HOSPITAL_COMMUNITY)
Admission: RE | Admit: 2020-08-19 | Discharge: 2020-08-19 | Disposition: A | Discharge status: Home / Self Care | Payer: Medicare Other | Source: Ambulatory Visit | Attending: Physician Assistant | Admitting: Physician Assistant

## 2020-08-19 DIAGNOSIS — L4050 Arthropathic psoriasis, unspecified: Secondary | ICD-10-CM | POA: Diagnosis present

## 2020-08-19 MED ORDER — USTEKINUMAB 45 MG/0.5ML ~~LOC~~ SOSY
45.0000 mg | PREFILLED_SYRINGE | Freq: Once | SUBCUTANEOUS | Status: AC
  Administered 2020-08-19: 45 mg via SUBCUTANEOUS
  Filled 2020-08-19: qty 0.5

## 2020-08-23 ENCOUNTER — Encounter (HOSPITAL_COMMUNITY): Payer: Medicare Other

## 2020-11-11 ENCOUNTER — Ambulatory Visit (HOSPITAL_COMMUNITY)
Admission: RE | Admit: 2020-11-11 | Discharge: 2020-11-11 | Disposition: A | Discharge status: Home / Self Care | Payer: Medicare Other | Source: Ambulatory Visit | Attending: Physician Assistant | Admitting: Physician Assistant

## 2020-11-11 ENCOUNTER — Inpatient Hospital Stay (HOSPITAL_COMMUNITY): Admission: RE | Admit: 2020-11-11 | Payer: Medicare Other | Source: Ambulatory Visit

## 2020-11-11 ENCOUNTER — Other Ambulatory Visit: Payer: Self-pay

## 2020-11-11 DIAGNOSIS — L4050 Arthropathic psoriasis, unspecified: Secondary | ICD-10-CM | POA: Insufficient documentation

## 2020-11-11 MED ORDER — USTEKINUMAB 45 MG/0.5ML ~~LOC~~ SOSY
45.0000 mg | PREFILLED_SYRINGE | Freq: Once | SUBCUTANEOUS | Status: AC
  Administered 2020-11-11: 45 mg via SUBCUTANEOUS
  Filled 2020-11-11: qty 0.5

## 2020-12-06 ENCOUNTER — Other Ambulatory Visit: Payer: Self-pay | Admitting: Obstetrics & Gynecology

## 2020-12-06 DIAGNOSIS — Z1231 Encounter for screening mammogram for malignant neoplasm of breast: Secondary | ICD-10-CM

## 2020-12-21 DIAGNOSIS — Z1231 Encounter for screening mammogram for malignant neoplasm of breast: Secondary | ICD-10-CM

## 2021-02-03 ENCOUNTER — Inpatient Hospital Stay (HOSPITAL_COMMUNITY): Admission: RE | Admit: 2021-02-03 | Payer: Medicare Other | Source: Ambulatory Visit

## 2021-02-08 ENCOUNTER — Other Ambulatory Visit: Payer: Self-pay | Admitting: Obstetrics & Gynecology

## 2021-02-08 ENCOUNTER — Other Ambulatory Visit (HOSPITAL_BASED_OUTPATIENT_CLINIC_OR_DEPARTMENT_OTHER): Payer: Self-pay

## 2021-02-18 ENCOUNTER — Ambulatory Visit
Admission: RE | Admit: 2021-02-18 | Discharge: 2021-02-18 | Disposition: A | Discharge status: Home / Self Care | Payer: Medicare Other | Source: Ambulatory Visit | Attending: Obstetrics & Gynecology | Admitting: Obstetrics & Gynecology

## 2021-02-18 ENCOUNTER — Other Ambulatory Visit: Payer: Self-pay

## 2021-02-18 DIAGNOSIS — Z1231 Encounter for screening mammogram for malignant neoplasm of breast: Secondary | ICD-10-CM

## 2021-03-03 ENCOUNTER — Ambulatory Visit: Payer: Medicare Other

## 2021-03-29 ENCOUNTER — Ambulatory Visit (HOSPITAL_BASED_OUTPATIENT_CLINIC_OR_DEPARTMENT_OTHER): Payer: Medicare Other | Admitting: Obstetrics & Gynecology

## 2021-03-30 ENCOUNTER — Ambulatory Visit (HOSPITAL_BASED_OUTPATIENT_CLINIC_OR_DEPARTMENT_OTHER): Payer: Medicare Other | Admitting: Obstetrics & Gynecology

## 2021-04-11 ENCOUNTER — Other Ambulatory Visit (HOSPITAL_COMMUNITY): Payer: Self-pay | Admitting: *Deleted

## 2021-04-12 ENCOUNTER — Ambulatory Visit (HOSPITAL_COMMUNITY)
Admission: RE | Admit: 2021-04-12 | Discharge: 2021-04-12 | Disposition: A | Discharge status: Home / Self Care | Payer: Medicare Other | Source: Ambulatory Visit | Attending: Rheumatology | Admitting: Rheumatology

## 2021-04-12 DIAGNOSIS — L4050 Arthropathic psoriasis, unspecified: Secondary | ICD-10-CM | POA: Diagnosis not present

## 2021-04-12 MED ORDER — USTEKINUMAB 45 MG/0.5ML ~~LOC~~ SOSY
45.0000 mg | PREFILLED_SYRINGE | Freq: Once | SUBCUTANEOUS | Status: AC
  Administered 2021-04-12: 45 mg via SUBCUTANEOUS
  Filled 2021-04-12: qty 0.5

## 2021-04-15 ENCOUNTER — Other Ambulatory Visit (HOSPITAL_COMMUNITY): Payer: Self-pay

## 2021-06-20 ENCOUNTER — Ambulatory Visit (HOSPITAL_BASED_OUTPATIENT_CLINIC_OR_DEPARTMENT_OTHER): Payer: Medicare Other | Admitting: Obstetrics & Gynecology

## 2021-07-04 ENCOUNTER — Other Ambulatory Visit (HOSPITAL_COMMUNITY): Payer: Self-pay | Admitting: *Deleted

## 2021-07-05 ENCOUNTER — Ambulatory Visit (HOSPITAL_COMMUNITY)
Admission: RE | Admit: 2021-07-05 | Discharge: 2021-07-05 | Disposition: A | Discharge status: Home / Self Care | Payer: Medicare Other | Source: Ambulatory Visit | Attending: Internal Medicine | Admitting: Internal Medicine

## 2021-07-05 ENCOUNTER — Other Ambulatory Visit: Payer: Self-pay

## 2021-07-05 DIAGNOSIS — L4050 Arthropathic psoriasis, unspecified: Secondary | ICD-10-CM | POA: Diagnosis present

## 2021-07-05 MED ORDER — USTEKINUMAB 45 MG/0.5ML ~~LOC~~ SOSY
45.0000 mg | PREFILLED_SYRINGE | Freq: Once | SUBCUTANEOUS | Status: AC
  Administered 2021-07-05: 45 mg via SUBCUTANEOUS
  Filled 2021-07-05: qty 0.5

## 2021-07-25 ENCOUNTER — Other Ambulatory Visit (HOSPITAL_COMMUNITY)
Admission: RE | Admit: 2021-07-25 | Discharge: 2021-07-25 | Disposition: A | Discharge status: Home / Self Care | Payer: Medicare Other | Source: Ambulatory Visit | Attending: Obstetrics & Gynecology | Admitting: Obstetrics & Gynecology

## 2021-07-25 ENCOUNTER — Other Ambulatory Visit: Payer: Self-pay

## 2021-07-25 ENCOUNTER — Ambulatory Visit (INDEPENDENT_AMBULATORY_CARE_PROVIDER_SITE_OTHER): Payer: Medicare Other | Admitting: Obstetrics & Gynecology

## 2021-07-25 ENCOUNTER — Encounter (HOSPITAL_BASED_OUTPATIENT_CLINIC_OR_DEPARTMENT_OTHER): Payer: Self-pay | Admitting: Obstetrics & Gynecology

## 2021-07-25 VITALS — BP 135/72 | HR 81 | Ht 62.75 in | Wt 150.0 lb

## 2021-07-25 DIAGNOSIS — E2839 Other primary ovarian failure: Secondary | ICD-10-CM

## 2021-07-25 DIAGNOSIS — M4690 Unspecified inflammatory spondylopathy, site unspecified: Secondary | ICD-10-CM | POA: Insufficient documentation

## 2021-07-25 DIAGNOSIS — M1991 Primary osteoarthritis, unspecified site: Secondary | ICD-10-CM | POA: Insufficient documentation

## 2021-07-25 DIAGNOSIS — Z79899 Other long term (current) drug therapy: Secondary | ICD-10-CM | POA: Insufficient documentation

## 2021-07-25 DIAGNOSIS — Z1151 Encounter for screening for human papillomavirus (HPV): Secondary | ICD-10-CM | POA: Diagnosis not present

## 2021-07-25 DIAGNOSIS — Z124 Encounter for screening for malignant neoplasm of cervix: Secondary | ICD-10-CM | POA: Diagnosis not present

## 2021-07-25 DIAGNOSIS — Z01419 Encounter for gynecological examination (general) (routine) without abnormal findings: Secondary | ICD-10-CM | POA: Insufficient documentation

## 2021-07-25 DIAGNOSIS — E039 Hypothyroidism, unspecified: Secondary | ICD-10-CM

## 2021-07-25 DIAGNOSIS — L4050 Arthropathic psoriasis, unspecified: Secondary | ICD-10-CM | POA: Diagnosis not present

## 2021-07-25 DIAGNOSIS — R7612 Nonspecific reaction to cell mediated immunity measurement of gamma interferon antigen response without active tuberculosis: Secondary | ICD-10-CM | POA: Insufficient documentation

## 2021-07-25 DIAGNOSIS — Z780 Asymptomatic menopausal state: Secondary | ICD-10-CM

## 2021-07-25 NOTE — Progress Notes (Signed)
70 y.o. G0P0000 Married White or Caucasian female here for breast and pelvic exam.  Denies vaginal bleeding.    Father and husband died this spring.  Father has been ill and was 36.  Husband died in his sleep.  This was a really hard spring.  Brother and his wife and her mother and patient are all going to her niece's in [place] for the holidays.  Her family also has a mt house and they will meet there after the holidays as well.    Rheumatology: Dr. Dierdre Forth Endocrinologist:  Dr. Talmage Nap.  Did lab work in November Psychiatrist:  Dr. Evelene Croon  Patient's last menstrual period was 12/06/2005.          Sexually active: No.  H/O STD:  no  Health Maintenance: PCP:  does not have one Vaccines are up to date:  reviewed Colonoscopy:  05/26/16 MMG:  02/18/21 neg BMD:  04/21/16 normal Last pap smear:  08/27/18 neg.   H/o abnormal pap smear:  no   reports that she has never smoked. She has never used smokeless tobacco. She reports current alcohol use of about 10.0 standard drinks per week. She reports that she does not use drugs.  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     celecoxib (CELEBREX) 200 MG capsule Take 200 mg by mouth 2 (two) times daily.   0   folic acid (FOLVITE) 1 MG tablet 1 tablet     levothyroxine (SYNTHROID,  LEVOTHROID) 75 MCG tablet   1   methotrexate (RHEUMATREX) 2.5 MG tablet 6 tablets     Olopatadine HCl 0.2 % SOLN INSTILL 1 DROP IN BOTH EYES EVERY DAY     sertraline (ZOLOFT) 100 MG tablet Take 200 mg by mouth.      ustekinumab (STELARA) 45 MG/0.5ML SOSY injection every 3 (three) months.     No current facility-administered medications for this visit.    Family History  Problem Relation Age of Onset   Diabetes Father        addm   Thyroid disease Father    Dementia Father    Thyroid disease Mother    Thyroid disease Brother     Review of Systems  All other systems reviewed and are negative.  Exam:   BP 135/72   Pulse 81   Ht 5' 2.75" (1.594 m)   Wt 150 lb (68 kg)   LMP 12/06/2005   BMI 26.78 kg/m   Height: 5' 2.75" (159.4 cm)  General appearance: alert, cooperative and appears stated age Breasts: normal appearance, no masses or tenderness Abdomen: soft, non-tender; bowel sounds normal; no masses,  no organomegaly Lymph nodes: Cervical, supraclavicular, and axillary nodes normal.  No abnormal inguinal nodes  palpated Neurologic: Grossly normal  Pelvic: External genitalia:  no lesions              Urethra:  normal appearing urethra with no masses, tenderness or lesions              Bartholins and Skenes: normal                 Vagina: normal appearing vagina with atrophic changes and no discharge, no lesions              Cervix: no lesions              Pap taken: Yes.   Bimanual Exam:  Uterus:  normal size, contour, position, consistency, mobility, non-tender              Adnexa: normal adnexa and no mass, fullness, tenderness               Rectovaginal: Confirms               Anus:  normal sphincter tone, no lesions  Chaperone, Raechel Ache, CMA, was present for exam.  Assessment/Plan: 1. Encntr for gyn exam (general) (routine) w/o abn findings - pap obtained today - MMG 02/2021 - colonoscopy 05/2016, dr. Loreta Ave, follow up 10 years - BMD due - lab work done with other  specialists - vaccines reviewed.  Pt is going to check with [facility] about which pneumonia vaccination she's had and then complete the series  2. Hypoestrogenism - DG BONE DENSITY (DXA); Future  3. Postmenopausal - no HRT  4. Cervical cancer screening - Cytology - PAP( [facility]) - PR OBTAINING SCREEN PAP SMEAR  5. Psoriatic arthritis (HCC) - followed by Dr. Dierdre Forth  6. Acquired hypothyroidism - followed by Dr. Talmage Nap

## 2021-07-27 LAB — CYTOLOGY - PAP
Comment: NEGATIVE
Diagnosis: NEGATIVE
High risk HPV: NEGATIVE

## 2021-09-27 ENCOUNTER — Inpatient Hospital Stay (HOSPITAL_COMMUNITY): Admission: RE | Admit: 2021-09-27 | Payer: Medicare Other | Source: Ambulatory Visit

## 2021-09-30 ENCOUNTER — Ambulatory Visit (HOSPITAL_COMMUNITY)
Admission: RE | Admit: 2021-09-30 | Discharge: 2021-09-30 | Disposition: A | Discharge status: Home / Self Care | Payer: Medicare Other | Source: Ambulatory Visit | Attending: Internal Medicine | Admitting: Internal Medicine

## 2021-09-30 ENCOUNTER — Other Ambulatory Visit: Payer: Self-pay

## 2021-09-30 DIAGNOSIS — L4050 Arthropathic psoriasis, unspecified: Secondary | ICD-10-CM | POA: Diagnosis present

## 2021-09-30 MED ORDER — USTEKINUMAB 45 MG/0.5ML ~~LOC~~ SOSY
45.0000 mg | PREFILLED_SYRINGE | SUBCUTANEOUS | Status: DC
  Administered 2021-09-30: 45 mg via SUBCUTANEOUS
  Filled 2021-09-30: qty 0.5

## 2021-12-23 ENCOUNTER — Ambulatory Visit (HOSPITAL_COMMUNITY)
Admission: RE | Admit: 2021-12-23 | Discharge: 2021-12-23 | Disposition: A | Discharge status: Home / Self Care | Payer: Medicare Other | Source: Ambulatory Visit | Attending: Internal Medicine | Admitting: Internal Medicine

## 2021-12-23 DIAGNOSIS — L4050 Arthropathic psoriasis, unspecified: Secondary | ICD-10-CM | POA: Diagnosis not present

## 2021-12-23 DIAGNOSIS — Z79899 Other long term (current) drug therapy: Secondary | ICD-10-CM | POA: Insufficient documentation

## 2021-12-23 DIAGNOSIS — Z124 Encounter for screening for malignant neoplasm of cervix: Secondary | ICD-10-CM | POA: Insufficient documentation

## 2021-12-23 MED ORDER — USTEKINUMAB 45 MG/0.5ML ~~LOC~~ SOSY
45.0000 mg | PREFILLED_SYRINGE | SUBCUTANEOUS | Status: DC
  Administered 2021-12-23: 45 mg via SUBCUTANEOUS
  Filled 2021-12-23: qty 0.5

## 2022-03-17 ENCOUNTER — Ambulatory Visit (HOSPITAL_COMMUNITY)
Admission: RE | Admit: 2022-03-17 | Discharge: 2022-03-17 | Disposition: A | Discharge status: Home / Self Care | Payer: Medicare Other | Source: Ambulatory Visit | Attending: Internal Medicine | Admitting: Internal Medicine

## 2022-03-17 DIAGNOSIS — L414 Large plaque parapsoriasis: Secondary | ICD-10-CM | POA: Diagnosis not present

## 2022-03-17 DIAGNOSIS — L4052 Psoriatic arthritis mutilans: Secondary | ICD-10-CM | POA: Insufficient documentation

## 2022-03-17 DIAGNOSIS — L4050 Arthropathic psoriasis, unspecified: Secondary | ICD-10-CM | POA: Diagnosis present

## 2022-03-17 MED ORDER — USTEKINUMAB 45 MG/0.5ML ~~LOC~~ SOSY
45.0000 mg | PREFILLED_SYRINGE | SUBCUTANEOUS | Status: DC
  Administered 2022-03-17: 45 mg via SUBCUTANEOUS
  Filled 2022-03-17: qty 0.5

## 2022-04-11 ENCOUNTER — Ambulatory Visit: Payer: Self-pay

## 2022-04-11 NOTE — Patient Instructions (Signed)
Visit Information  Thank you for taking time to visit with me today. Please don't hesitate to contact me if I can be of assistance to you.   Following are the goals we discussed today:   Goals Addressed             This Visit's Progress    COMPLETED: Care Coordination Activites- no follow up required        Care Coordination Interventions: Active listening / Reflection utilized  [employer] Program 2.  Annual Wellness Visit 3.  Discussed Social Determinates of Health          Patient verbalizes understanding of instructions and care plan provided today and agrees to view in [place]. Active MyChart status and patient understanding of how to access instructions and care plan via MyChart confirmed with patient.     Juanell Fairly RN, BSN, [facility] Care Coordinator [facility]   Phone: [phone]

## 2022-04-11 NOTE — Patient Outreach (Signed)
  Care Coordination   Initial Visit Note   04/11/2022 Name: Wendy Fletcher MRN: [id number] DOB: Apr 04, 1951  Wendy Fletcher is a 71 y.o. year old female who sees Patient, No Pcp Per for primary care. I spoke with  Wendy Fletcher by phone today.  What matters to the patients health and wellness today?  No care or concerns at this time.     Goals Addressed             This Visit's Progress    COMPLETED: Care Coordination Activites- no follow up required        Care Coordination Interventions: Active listening / Reflection utilized  Wendy Fletcher Program 2.  Annual Wellness Visit 3.  Discussed Social Determinates of Health         SDOH assessments and interventions completed:  {YesTHN Tip this will not be part of the note when signed-REQUIRED REPORT FIELD DO NOT DELETE (Optional):[id number]}  SDOH Interventions Today    Flowsheet Row Most Recent Value  SDOH Interventions   Food Insecurity Interventions Intervention Not Indicated  Transportation Interventions Intervention Not Indicated        Care Coordination Interventions Activated:  Yes  Care Coordination Interventions:  Yes, provided   Follow up plan: No further intervention required.   Encounter Outcome:  Pt. Visit Completed   Wendy Fairly RN, BSN, [facility] Care Coordinator [facility]   Phone: [phone]

## 2022-04-24 ENCOUNTER — Other Ambulatory Visit: Payer: Self-pay | Admitting: Obstetrics & Gynecology

## 2022-04-24 DIAGNOSIS — Z1231 Encounter for screening mammogram for malignant neoplasm of breast: Secondary | ICD-10-CM

## 2022-05-09 ENCOUNTER — Ambulatory Visit: Payer: Medicare Other

## 2022-06-01 ENCOUNTER — Other Ambulatory Visit (HOSPITAL_COMMUNITY): Payer: Self-pay

## 2022-06-05 ENCOUNTER — Ambulatory Visit
Admission: RE | Admit: 2022-06-05 | Discharge: 2022-06-05 | Disposition: A | Discharge status: Home / Self Care | Payer: Medicare Other | Source: Ambulatory Visit | Attending: Obstetrics & Gynecology | Admitting: Obstetrics & Gynecology

## 2022-06-05 ENCOUNTER — Ambulatory Visit (HOSPITAL_COMMUNITY)
Admission: RE | Admit: 2022-06-05 | Discharge: 2022-06-05 | Disposition: A | Discharge status: Home / Self Care | Payer: Medicare Other | Source: Ambulatory Visit | Attending: Internal Medicine | Admitting: Internal Medicine

## 2022-06-05 DIAGNOSIS — L4050 Arthropathic psoriasis, unspecified: Secondary | ICD-10-CM | POA: Insufficient documentation

## 2022-06-05 DIAGNOSIS — Z1231 Encounter for screening mammogram for malignant neoplasm of breast: Secondary | ICD-10-CM

## 2022-06-05 MED ORDER — USTEKINUMAB 45 MG/0.5ML ~~LOC~~ SOSY
45.0000 mg | PREFILLED_SYRINGE | SUBCUTANEOUS | Status: DC
  Administered 2022-06-05: 45 mg via SUBCUTANEOUS
  Filled 2022-06-05: qty 0.5

## 2022-08-28 ENCOUNTER — Ambulatory Visit (HOSPITAL_COMMUNITY)
Admission: RE | Admit: 2022-08-28 | Discharge: 2022-08-28 | Disposition: A | Discharge status: Home / Self Care | Payer: Medicare Other | Source: Ambulatory Visit | Attending: Internal Medicine | Admitting: Internal Medicine

## 2022-08-28 DIAGNOSIS — L4050 Arthropathic psoriasis, unspecified: Secondary | ICD-10-CM | POA: Insufficient documentation

## 2022-08-28 MED ORDER — USTEKINUMAB 45 MG/0.5ML ~~LOC~~ SOSY
45.0000 mg | PREFILLED_SYRINGE | SUBCUTANEOUS | Status: DC
  Administered 2022-08-28: 45 mg via SUBCUTANEOUS
  Filled 2022-08-28: qty 0.5

## 2022-09-22 ENCOUNTER — Other Ambulatory Visit (HOSPITAL_BASED_OUTPATIENT_CLINIC_OR_DEPARTMENT_OTHER): Payer: Self-pay | Admitting: Obstetrics & Gynecology

## 2022-09-23 ENCOUNTER — Other Ambulatory Visit (HOSPITAL_BASED_OUTPATIENT_CLINIC_OR_DEPARTMENT_OTHER): Payer: Self-pay | Admitting: Obstetrics & Gynecology

## 2022-09-23 MED ORDER — SERTRALINE HCL 100 MG PO TABS: 200.0000 mg | ORAL_TABLET | Freq: Every day | ORAL | 1 refills | Status: DC

## 2022-09-25 ENCOUNTER — Other Ambulatory Visit (HOSPITAL_BASED_OUTPATIENT_CLINIC_OR_DEPARTMENT_OTHER): Payer: Self-pay | Admitting: Obstetrics & Gynecology

## 2022-09-25 ENCOUNTER — Telehealth (HOSPITAL_BASED_OUTPATIENT_CLINIC_OR_DEPARTMENT_OTHER): Payer: Self-pay

## 2022-09-25 MED ORDER — SERTRALINE HCL 100 MG PO TABS: 200.0000 mg | ORAL_TABLET | Freq: Every day | ORAL | 1 refills | Status: DC

## 2022-09-28 NOTE — Telephone Encounter (Signed)
Patient called in for Zoloft refill. Per Sabra Heck, patient needs an appointment. Will call patient to set up appointment. Sabra Heck will refill until appointment. tbw

## 2022-10-02 ENCOUNTER — Encounter: Payer: Self-pay | Admitting: *Deleted

## 2022-11-20 ENCOUNTER — Ambulatory Visit (HOSPITAL_COMMUNITY)
Admission: RE | Admit: 2022-11-20 | Discharge: 2022-11-20 | Disposition: A | Discharge status: Home / Self Care | Payer: Medicare Other | Source: Ambulatory Visit | Attending: Internal Medicine | Admitting: Internal Medicine

## 2022-11-20 DIAGNOSIS — L4050 Arthropathic psoriasis, unspecified: Secondary | ICD-10-CM | POA: Insufficient documentation

## 2022-11-20 MED ORDER — USTEKINUMAB 45 MG/0.5ML ~~LOC~~ SOSY
45.0000 mg | PREFILLED_SYRINGE | SUBCUTANEOUS | Status: DC
  Administered 2022-11-20: 45 mg via SUBCUTANEOUS
  Filled 2022-11-20: qty 0.5

## 2023-01-22 ENCOUNTER — Encounter (HOSPITAL_BASED_OUTPATIENT_CLINIC_OR_DEPARTMENT_OTHER): Payer: Self-pay | Admitting: Obstetrics & Gynecology

## 2023-02-05 ENCOUNTER — Ambulatory Visit (INDEPENDENT_AMBULATORY_CARE_PROVIDER_SITE_OTHER): Payer: Medicare Other | Admitting: Obstetrics & Gynecology

## 2023-02-05 ENCOUNTER — Encounter (HOSPITAL_BASED_OUTPATIENT_CLINIC_OR_DEPARTMENT_OTHER): Payer: Self-pay | Admitting: Obstetrics & Gynecology

## 2023-02-05 VITALS — BP 138/70 | HR 76 | Ht 64.0 in | Wt 148.8 lb

## 2023-02-05 DIAGNOSIS — Z13820 Encounter for screening for osteoporosis: Secondary | ICD-10-CM

## 2023-02-05 DIAGNOSIS — F419 Anxiety disorder, unspecified: Secondary | ICD-10-CM | POA: Diagnosis not present

## 2023-02-05 DIAGNOSIS — E039 Hypothyroidism, unspecified: Secondary | ICD-10-CM

## 2023-02-05 DIAGNOSIS — E2839 Other primary ovarian failure: Secondary | ICD-10-CM

## 2023-02-05 MED ORDER — SERTRALINE HCL 100 MG PO TABS: 200.0000 mg | ORAL_TABLET | Freq: Every day | ORAL | 3 refills | Status: DC

## 2023-02-06 DIAGNOSIS — F419 Anxiety disorder, unspecified: Secondary | ICD-10-CM | POA: Insufficient documentation

## 2023-02-06 DIAGNOSIS — E039 Hypothyroidism, unspecified: Secondary | ICD-10-CM | POA: Insufficient documentation

## 2023-02-06 NOTE — Progress Notes (Signed)
GYNECOLOGY  VISIT  CC:   med refill  HPI: 72 y.o. G0P0000 Widowed White or Caucasian female here for discussion of zoloft.  Has been prescribed for years by Dr. Evelene Croon.  Dosage has not changed.  Does feel she needs to see Dr. Evelene Croon any longer.  Did have PCP but Dr. Cyndia Bent is moving into administrative work.  Doing well with zoloft.  Initially was prescribed for anxiety.  Last appt 07/25/2021.  Does have future appt scheduled.  Health maintenance reviewed.    MMG 06/05/2022 BMD 2017.  Discussed updating with mammogram.  Pt comfortable with this. Colonoscopy 05/2016.  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   Thyroid disease    hypothyroidism    MEDS:   Current Outpatient Medications on File Prior to Visit  Medication Sig Dispense Refill   atorvastatin (LIPITOR) 10 MG tablet Take 10 mg by mouth daily.     celecoxib (CELEBREX) 200 MG capsule Take 200 mg by mouth 2 (two) times daily.   0   folic acid (FOLVITE) 1 MG tablet 1 tablet     levothyroxine (SYNTHROID, LEVOTHROID) 75 MCG tablet   1   methotrexate (RHEUMATREX) 2.5 MG tablet 6 tablets     Olopatadine HCl 0.2 % SOLN INSTILL 1 DROP IN BOTH EYES EVERY DAY     ustekinumab (STELARA) 45 MG/0.5ML SOSY injection every 3 (three) months.     No current facility-administered medications on file prior to visit.    ALLERGIES: Rifampin  SH:  widowed, non smoker  Review of Systems  Constitutional: Negative.   Psychiatric/Behavioral: Negative.      PHYSICAL EXAMINATION:    BP 138/70 (BP Location: Right Arm, Patient Position: Sitting, Cuff Size: Normal)   Pulse 76   Ht 5\' 4"  (1.626 m) Comment: Reported  Wt  148 lb 12.8 oz (67.5 kg)   LMP 12/06/2005   BMI 25.54 kg/m     Physical Exam Constitutional:      Appearance: Normal appearance.  Neurological:     General: No focal deficit present.     Mental Status: She is alert and oriented to person, place, and time.  Psychiatric:        Mood and Affect: Mood normal.        Thought Content: Thought content normal.        Judgment: Judgment normal.      Assessment/Plan: 1. Anxiety - sertraline (ZOLOFT) 100 MG tablet; Take 2 tablets (200 mg total) by mouth daily for 180 doses.  Dispense: 180 tablet; Refill: 3  2. Acquired hypothyroidism - on levothyroxine.  Doesn't need RF.  Planning on establishing care with PCP  3. Osteoporosis screening - BMD ordered for pt to do with MMG this fall  4. Hypoestrogenism - DG BONE DENSITY (DXA); Future

## 2023-02-12 ENCOUNTER — Ambulatory Visit (HOSPITAL_COMMUNITY)
Admission: RE | Admit: 2023-02-12 | Discharge: 2023-02-12 | Disposition: A | Discharge status: Home / Self Care | Payer: Medicare Other | Source: Ambulatory Visit | Attending: Internal Medicine | Admitting: Internal Medicine

## 2023-02-12 DIAGNOSIS — L4050 Arthropathic psoriasis, unspecified: Secondary | ICD-10-CM | POA: Insufficient documentation

## 2023-02-12 MED ORDER — USTEKINUMAB 45 MG/0.5ML ~~LOC~~ SOSY
45.0000 mg | PREFILLED_SYRINGE | SUBCUTANEOUS | Status: DC
  Administered 2023-02-12: 45 mg via SUBCUTANEOUS
  Filled 2023-02-12: qty 0.5

## 2023-05-14 ENCOUNTER — Other Ambulatory Visit (HOSPITAL_COMMUNITY): Payer: Self-pay | Admitting: *Deleted

## 2023-05-15 ENCOUNTER — Ambulatory Visit (HOSPITAL_COMMUNITY)
Admission: RE | Admit: 2023-05-15 | Discharge: 2023-05-15 | Disposition: A | Discharge status: Home / Self Care | Payer: Medicare Other | Source: Ambulatory Visit | Attending: Internal Medicine | Admitting: Internal Medicine

## 2023-05-15 DIAGNOSIS — L4050 Arthropathic psoriasis, unspecified: Secondary | ICD-10-CM | POA: Insufficient documentation

## 2023-05-15 MED ORDER — USTEKINUMAB 45 MG/0.5ML ~~LOC~~ SOSY
45.0000 mg | PREFILLED_SYRINGE | Freq: Once | SUBCUTANEOUS | Status: AC
  Administered 2023-05-15: 45 mg via SUBCUTANEOUS
  Filled 2023-05-15: qty 0.5

## 2023-08-06 ENCOUNTER — Other Ambulatory Visit (HOSPITAL_COMMUNITY): Payer: Self-pay | Admitting: *Deleted

## 2023-08-07 ENCOUNTER — Ambulatory Visit (HOSPITAL_COMMUNITY)
Admission: RE | Admit: 2023-08-07 | Discharge: 2023-08-07 | Disposition: A | Discharge status: Home / Self Care | Payer: Medicare Other | Source: Ambulatory Visit | Attending: Internal Medicine | Admitting: Internal Medicine

## 2023-08-07 DIAGNOSIS — L4050 Arthropathic psoriasis, unspecified: Secondary | ICD-10-CM | POA: Insufficient documentation

## 2023-08-07 DIAGNOSIS — Z79620 Long term (current) use of immunosuppressive biologic: Secondary | ICD-10-CM | POA: Insufficient documentation

## 2023-08-07 MED ORDER — USTEKINUMAB 45 MG/0.5ML ~~LOC~~ SOSY
45.0000 mg | PREFILLED_SYRINGE | SUBCUTANEOUS | Status: DC
  Administered 2023-08-07: 45 mg via SUBCUTANEOUS
  Filled 2023-08-07: qty 0.5

## 2023-10-29 ENCOUNTER — Ambulatory Visit
Admission: RE | Admit: 2023-10-29 | Discharge: 2023-10-29 | Disposition: A | Discharge status: Home / Self Care | Payer: Medicare Other | Source: Ambulatory Visit | Attending: Obstetrics & Gynecology | Admitting: Obstetrics & Gynecology

## 2023-10-29 ENCOUNTER — Other Ambulatory Visit: Payer: Self-pay | Admitting: Obstetrics & Gynecology

## 2023-10-29 DIAGNOSIS — Z13820 Encounter for screening for osteoporosis: Secondary | ICD-10-CM

## 2023-10-29 DIAGNOSIS — Z0000 Encounter for general adult medical examination without abnormal findings: Secondary | ICD-10-CM

## 2023-10-29 DIAGNOSIS — E2839 Other primary ovarian failure: Secondary | ICD-10-CM

## 2023-10-30 ENCOUNTER — Ambulatory Visit (HOSPITAL_COMMUNITY)
Admission: RE | Admit: 2023-10-30 | Discharge: 2023-10-30 | Disposition: A | Discharge status: Home / Self Care | Payer: Medicare Other | Source: Ambulatory Visit | Attending: Internal Medicine | Admitting: Internal Medicine

## 2023-10-30 DIAGNOSIS — L4050 Arthropathic psoriasis, unspecified: Secondary | ICD-10-CM | POA: Diagnosis present

## 2023-10-30 DIAGNOSIS — Z79620 Long term (current) use of immunosuppressive biologic: Secondary | ICD-10-CM | POA: Diagnosis not present

## 2023-10-30 MED ORDER — USTEKINUMAB 45 MG/0.5ML ~~LOC~~ SOSY
45.0000 mg | PREFILLED_SYRINGE | SUBCUTANEOUS | Status: DC
  Administered 2023-10-30: 45 mg via SUBCUTANEOUS
  Filled 2023-10-30: qty 0.5

## 2023-11-02 ENCOUNTER — Encounter (HOSPITAL_BASED_OUTPATIENT_CLINIC_OR_DEPARTMENT_OTHER): Payer: Self-pay | Admitting: Obstetrics & Gynecology

## 2023-11-06 ENCOUNTER — Ambulatory Visit

## 2024-01-22 ENCOUNTER — Ambulatory Visit (HOSPITAL_COMMUNITY)
Admission: RE | Admit: 2024-01-22 | Discharge: 2024-01-22 | Disposition: A | Discharge status: Home / Self Care | Source: Ambulatory Visit | Attending: Internal Medicine | Admitting: Internal Medicine

## 2024-01-22 DIAGNOSIS — Z79620 Long term (current) use of immunosuppressive biologic: Secondary | ICD-10-CM | POA: Insufficient documentation

## 2024-01-22 DIAGNOSIS — L4050 Arthropathic psoriasis, unspecified: Secondary | ICD-10-CM | POA: Diagnosis present

## 2024-01-22 MED ORDER — USTEKINUMAB 45 MG/0.5ML ~~LOC~~ SOSY
45.0000 mg | PREFILLED_SYRINGE | SUBCUTANEOUS | Status: DC
  Administered 2024-01-22: 45 mg via SUBCUTANEOUS
  Filled 2024-01-22: qty 0.5

## 2024-01-22 MED ORDER — USTEKINUMAB 45 MG/0.5ML ~~LOC~~ SOSY
45.0000 mg | PREFILLED_SYRINGE | SUBCUTANEOUS | Status: DC
  Filled 2024-01-22: qty 0.5

## 2024-01-24 ENCOUNTER — Telehealth: Payer: Self-pay

## 2024-01-24 NOTE — Telephone Encounter (Signed)
 Auth Submission: NO AUTH NEEDED Site of care: Site of care: [facility] Payer: Medicare A/B with [employer] Medication & CPT/J Code(s) submitted: Stelara  SQ injection (Ustekinumab ) J3357 Route of submission (phone, fax, portal):  Phone # Fax # Auth type: Buy/Bill PB Units/visits requested: 45mg  x 4 doses Reference number:  Approval from: 08/15/23 to 09/13/24

## 2024-04-11 ENCOUNTER — Other Ambulatory Visit (HOSPITAL_BASED_OUTPATIENT_CLINIC_OR_DEPARTMENT_OTHER): Payer: Self-pay | Admitting: Obstetrics & Gynecology

## 2024-04-11 ENCOUNTER — Other Ambulatory Visit: Payer: Self-pay

## 2024-04-11 DIAGNOSIS — F419 Anxiety disorder, unspecified: Secondary | ICD-10-CM

## 2024-04-15 ENCOUNTER — Ambulatory Visit (HOSPITAL_COMMUNITY)
Admission: RE | Admit: 2024-04-15 | Discharge: 2024-04-15 | Disposition: A | Discharge status: Home / Self Care | Source: Ambulatory Visit | Attending: Internal Medicine

## 2024-04-15 DIAGNOSIS — L4050 Arthropathic psoriasis, unspecified: Secondary | ICD-10-CM | POA: Diagnosis present

## 2024-04-15 MED ORDER — USTEKINUMAB 45 MG/0.5ML ~~LOC~~ SOSY
45.0000 mg | PREFILLED_SYRINGE | SUBCUTANEOUS | Status: DC
  Administered 2024-04-15: 45 mg via SUBCUTANEOUS
  Filled 2024-04-15: qty 0.5

## 2024-04-28 ENCOUNTER — Other Ambulatory Visit (HOSPITAL_COMMUNITY): Payer: Self-pay | Admitting: Internal Medicine

## 2024-05-05 ENCOUNTER — Other Ambulatory Visit: Payer: Self-pay

## 2024-07-08 ENCOUNTER — Ambulatory Visit (HOSPITAL_COMMUNITY)
Admission: RE | Admit: 2024-07-08 | Discharge: 2024-07-08 | Disposition: A | Discharge status: Home / Self Care | Source: Ambulatory Visit | Attending: Internal Medicine | Admitting: Internal Medicine

## 2024-07-08 VITALS — BP 124/77 | HR 86 | Temp 97.0°F | Resp 16

## 2024-07-08 DIAGNOSIS — L4050 Arthropathic psoriasis, unspecified: Secondary | ICD-10-CM | POA: Insufficient documentation

## 2024-07-08 MED ORDER — USTEKINUMAB 90 MG/ML ~~LOC~~ SOSY
45.0000 mg | PREFILLED_SYRINGE | Freq: Once | SUBCUTANEOUS | Status: AC
  Administered 2024-07-08: 45 mg via SUBCUTANEOUS
  Filled 2024-07-08: qty 0.5

## 2024-07-18 ENCOUNTER — Other Ambulatory Visit (HOSPITAL_BASED_OUTPATIENT_CLINIC_OR_DEPARTMENT_OTHER): Payer: Self-pay

## 2024-07-18 ENCOUNTER — Other Ambulatory Visit (HOSPITAL_BASED_OUTPATIENT_CLINIC_OR_DEPARTMENT_OTHER): Payer: Self-pay | Admitting: Obstetrics & Gynecology

## 2024-07-18 DIAGNOSIS — F419 Anxiety disorder, unspecified: Secondary | ICD-10-CM

## 2024-09-30 ENCOUNTER — Inpatient Hospital Stay (HOSPITAL_COMMUNITY): Admission: RE | Admit: 2024-09-30
